# Patient Record
Sex: Female | Born: 1995 | Race: White | Hispanic: No | Marital: Single | Smoking: Current every day smoker
Health system: Southern US, Community
[De-identification: ages and names within clinical notes are randomized; demographics above are authoritative.]

## PROBLEM LIST (undated history)

## (undated) ENCOUNTER — Inpatient Hospital Stay (HOSPITAL_COMMUNITY): Payer: Self-pay

## (undated) DIAGNOSIS — F259 Schizoaffective disorder, unspecified: Secondary | ICD-10-CM

## (undated) DIAGNOSIS — N189 Chronic kidney disease, unspecified: Secondary | ICD-10-CM

## (undated) DIAGNOSIS — F419 Anxiety disorder, unspecified: Secondary | ICD-10-CM

## (undated) DIAGNOSIS — R55 Syncope and collapse: Secondary | ICD-10-CM

## (undated) DIAGNOSIS — F319 Bipolar disorder, unspecified: Secondary | ICD-10-CM

## (undated) DIAGNOSIS — I2699 Other pulmonary embolism without acute cor pulmonale: Secondary | ICD-10-CM

## (undated) DIAGNOSIS — F329 Major depressive disorder, single episode, unspecified: Secondary | ICD-10-CM

## (undated) DIAGNOSIS — J45909 Unspecified asthma, uncomplicated: Secondary | ICD-10-CM

## (undated) DIAGNOSIS — F32A Depression, unspecified: Secondary | ICD-10-CM

## (undated) DIAGNOSIS — R519 Headache, unspecified: Secondary | ICD-10-CM

## (undated) DIAGNOSIS — R51 Headache: Secondary | ICD-10-CM

## (undated) HISTORY — PX: TOOTH EXTRACTION: SUR596

---
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
## 2015-01-24 ENCOUNTER — Emergency Department (HOSPITAL_COMMUNITY)
Admission: EM | Admit: 2015-01-24 | Discharge: 2015-01-24 | Disposition: A | Discharge status: Home / Self Care | Attending: Emergency Medicine | Admitting: Emergency Medicine

## 2015-01-24 ENCOUNTER — Encounter (HOSPITAL_COMMUNITY): Payer: Self-pay | Admitting: Physical Medicine and Rehabilitation

## 2015-01-24 DIAGNOSIS — O99331 Smoking (tobacco) complicating pregnancy, first trimester: Secondary | ICD-10-CM | POA: Insufficient documentation

## 2015-01-24 DIAGNOSIS — F329 Major depressive disorder, single episode, unspecified: Secondary | ICD-10-CM | POA: Insufficient documentation

## 2015-01-24 DIAGNOSIS — Z79899 Other long term (current) drug therapy: Secondary | ICD-10-CM | POA: Diagnosis not present

## 2015-01-24 DIAGNOSIS — Z3A01 Less than 8 weeks gestation of pregnancy: Secondary | ICD-10-CM | POA: Diagnosis not present

## 2015-01-24 DIAGNOSIS — F17210 Nicotine dependence, cigarettes, uncomplicated: Secondary | ICD-10-CM | POA: Diagnosis not present

## 2015-01-24 DIAGNOSIS — F419 Anxiety disorder, unspecified: Secondary | ICD-10-CM | POA: Diagnosis not present

## 2015-01-24 DIAGNOSIS — F259 Schizoaffective disorder, unspecified: Secondary | ICD-10-CM | POA: Insufficient documentation

## 2015-01-24 DIAGNOSIS — Z86711 Personal history of pulmonary embolism: Secondary | ICD-10-CM | POA: Diagnosis not present

## 2015-01-24 DIAGNOSIS — R4589 Other symptoms and signs involving emotional state: Secondary | ICD-10-CM

## 2015-01-24 DIAGNOSIS — O99341 Other mental disorders complicating pregnancy, first trimester: Secondary | ICD-10-CM | POA: Diagnosis not present

## 2015-01-24 LAB — COMPREHENSIVE METABOLIC PANEL
ALBUMIN: 3.9 g/dL (ref 3.5–5.2)
ALT: 13 U/L (ref 0–35)
AST: 20 U/L (ref 0–37)
Alkaline Phosphatase: 45 U/L (ref 39–117)
Anion gap: 7 (ref 5–15)
BUN: 10 mg/dL (ref 6–23)
CHLORIDE: 110 mmol/L (ref 96–112)
CO2: 21 mmol/L (ref 19–32)
Calcium: 9.4 mg/dL (ref 8.4–10.5)
Creatinine, Ser: 0.78 mg/dL (ref 0.50–1.10)
GFR calc non Af Amer: >90 mL/min (ref >90)
Glucose, Bld: 114 mg/dL — ABNORMAL HIGH (ref 70–99)
Potassium: 3.9 mmol/L (ref 3.5–5.1)
SODIUM: 138 mmol/L (ref 135–145)
TOTAL PROTEIN: 6.8 g/dL (ref 6.0–8.3)
Total Bilirubin: 0.2 mg/dL — ABNORMAL LOW (ref 0.3–1.2)

## 2015-01-24 LAB — CBC WITH DIFFERENTIAL/PLATELET
BASOS ABS: 0 10*3/uL (ref 0.0–0.1)
BASOS PCT: 0 % (ref 0–1)
Eosinophils Absolute: 0.1 10*3/uL (ref 0.0–0.7)
Eosinophils Relative: 1 % (ref 0–5)
HCT: 41.6 % (ref 36.0–46.0)
Hemoglobin: 14.5 g/dL (ref 12.0–15.0)
LYMPHS ABS: 2.5 10*3/uL (ref 0.7–4.0)
LYMPHS PCT: 30 % (ref 12–46)
MCH: 31.7 pg (ref 26.0–34.0)
MCHC: 34.9 g/dL (ref 30.0–36.0)
MCV: 91 fL (ref 78.0–100.0)
MONO ABS: 0.4 10*3/uL (ref 0.1–1.0)
Monocytes Relative: 5 % (ref 3–12)
Neutro Abs: 5.3 10*3/uL (ref 1.7–7.7)
Neutrophils Relative %: 64 % (ref 43–77)
PLATELETS: 229 10*3/uL (ref 150–400)
RBC: 4.57 MIL/uL (ref 3.87–5.11)
RDW: 12.8 % (ref 11.5–15.5)
WBC: 8.2 10*3/uL (ref 4.0–10.5)

## 2015-01-24 LAB — RAPID URINE DRUG SCREEN, HOSP PERFORMED
Amphetamines: NOT DETECTED
Barbiturates: NOT DETECTED
Benzodiazepines: NOT DETECTED
Cocaine: NOT DETECTED
OPIATES: NOT DETECTED
TETRAHYDROCANNABINOL: NOT DETECTED

## 2015-01-24 LAB — ACETAMINOPHEN LEVEL: Acetaminophen (Tylenol), Serum: 19.2 ug/mL (ref 10–30)

## 2015-01-24 LAB — SALICYLATE LEVEL

## 2015-01-24 LAB — POC URINE PREG, ED: Preg Test, Ur: POSITIVE — AB

## 2015-01-24 LAB — ETHANOL: Alcohol, Ethyl (B): <5 mg/dL (ref 0–9)

## 2015-01-24 MED ORDER — ONDANSETRON HCL 4 MG PO TABS: 4.0000 mg | ORAL_TABLET | Freq: Three times a day (TID) | ORAL | Status: DC | PRN

## 2015-01-24 MED ORDER — ALBUTEROL SULFATE HFA 108 (90 BASE) MCG/ACT IN AERS
2.0000 | INHALATION_SPRAY | Freq: Once | RESPIRATORY_TRACT | Status: AC
  Administered 2015-01-24: 2 via RESPIRATORY_TRACT
  Filled 2015-01-24: qty 6.7

## 2015-01-24 MED ORDER — ACETAMINOPHEN 325 MG PO TABS: 650.0000 mg | ORAL_TABLET | ORAL | Status: DC | PRN

## 2015-01-24 NOTE — Discharge Instructions (Signed)
Depression °Depression refers to feeling sad, low, down in the dumps, blue, gloomy, or empty. In general, there are two kinds of depression: °· Normal sadness or normal grief. This kind of depression is one that we all feel from time to time after upsetting life experiences, such as the loss of a job or the ending of a relationship. This kind of depression is considered normal, is short lived, and resolves within a few days to 2 weeks. Depression experienced after the loss of a loved one (bereavement) often lasts longer than 2 weeks but normally gets better with time. °· Clinical depression. This kind of depression lasts longer than normal sadness or normal grief or interferes with your ability to function at home, at work, and in school. It also interferes with your personal relationships. It affects almost every aspect of your life. Clinical depression is an illness. °Symptoms of depression can also be caused by conditions other than those mentioned above, such as: °· Physical illness. Some physical illnesses, including underactive thyroid gland (hypothyroidism), severe anemia, specific types of cancer, diabetes, uncontrolled seizures, heart and lung problems, strokes, and chronic pain are commonly associated with symptoms of depression. °· Side effects of some prescription medicine. In some people, certain types of medicine can cause symptoms of depression. °· Substance abuse. Abuse of alcohol and illicit drugs can cause symptoms of depression. °SYMPTOMS °Symptoms of normal sadness and normal grief include the following: °· Feeling sad or crying for short periods of time. °· Not caring about anything (apathy). °· Difficulty sleeping or sleeping too much. °· No longer able to enjoy the things you used to enjoy. °· Desire to be by oneself all the time (social isolation). °· Lack of energy or motivation. °· Difficulty concentrating or remembering. °· Change in appetite or weight. °· Restlessness or  agitation. °Symptoms of clinical depression include the same symptoms of normal sadness or normal grief and also the following symptoms: °· Feeling sad or crying all the time. °· Feelings of guilt or worthlessness. °· Feelings of hopelessness or helplessness. °· Thoughts of suicide or the desire to harm yourself (suicidal ideation). °· Loss of touch with reality (psychotic symptoms). Seeing or hearing things that are not real (hallucinations) or having false beliefs about your life or the people around you (delusions and paranoia). °DIAGNOSIS  °The diagnosis of clinical depression is usually based on how bad the symptoms are and how long they have lasted. Your health care provider will also ask you questions about your medical history and substance use to find out if physical illness, use of prescription medicine, or substance abuse is causing your depression. Your health care provider may also order blood tests. °TREATMENT  °Often, normal sadness and normal grief do not require treatment. However, sometimes antidepressant medicine is given for bereavement to ease the depressive symptoms until they resolve. °The treatment for clinical depression depends on how bad the symptoms are but often includes antidepressant medicine, counseling with a mental health professional, or both. Your health care provider will help to determine what treatment is best for you. °Depression caused by physical illness usually goes away with appropriate medical treatment of the illness. If prescription medicine is causing depression, talk with your health care provider about stopping the medicine, decreasing the dose, or changing to another medicine. °Depression caused by the abuse of alcohol or illicit drugs goes away when you stop using these substances. Some adults need professional help in order to stop drinking or using drugs. °SEEK IMMEDIATE MEDICAL   CARE IF:  You have thoughts about hurting yourself or others.  You lose touch  with reality (have psychotic symptoms).  You are taking medicine for depression and have a serious side effect. FOR MORE INFORMATION  National Alliance on Mental Illness: www.nami.[employer]org  National Institute of Mental Health: http://www.maynard.net/www.nimh.nih.gov Document Released: 12/10/2000 Document Revised: 04/29/2014 Document Reviewed: 03/13/2012 [facility] Patient Information 2015 [place], [place]. This information is not intended to replace advice given to you by your health care provider. Make sure you discuss any questions you have with your health care provider. First Trimester of Pregnancy The first trimester of pregnancy is from week 1 until the end of week 12 (months 1 through 3). A week after a sperm fertilizes an egg, the egg will implant on the wall of the uterus. This embryo will begin to develop into a baby. Genes from you and your partner are forming the baby. The female genes determine whether the baby is a boy or a girl. At 6-8 weeks, the eyes and face are formed, and the heartbeat can be seen on ultrasound. At the end of 12 weeks, all the baby's organs are formed.  Now that you are pregnant, you will want to do everything you can to have a healthy baby. Two of the most important things are to get good prenatal care and to follow your health care provider's instructions. Prenatal care is all the medical care you receive before the baby's birth. This care will help prevent, find, and treat any problems during the pregnancy and childbirth. BODY CHANGES Your body goes through many changes during pregnancy. The changes vary from woman to woman.   You may gain or lose a couple of pounds at first.  You may feel sick to your stomach (nauseous) and throw up (vomit). If the vomiting is uncontrollable, call your health care provider.  You may tire easily.  You may develop headaches that can be relieved by medicines approved by your health care provider.  You may urinate more often. Painful urination may mean  you have a bladder infection.  You may develop heartburn as a result of your pregnancy.  You may develop constipation because certain hormones are causing the muscles that push waste through your intestines to slow down.  You may develop hemorrhoids or swollen, bulging veins (varicose veins).  Your breasts may begin to grow larger and become tender. Your nipples may stick out more, and the tissue that surrounds them (areola) may become darker.  Your gums may bleed and may be sensitive to brushing and flossing.  Dark spots or blotches (chloasma, mask of pregnancy) may develop on your face. This will likely fade after the baby is born.  Your menstrual periods will stop.  You may have a loss of appetite.  You may develop cravings for certain kinds of food.  You may have changes in your emotions from day to day, such as being excited to be pregnant or being concerned that something may go wrong with the pregnancy and baby.  You may have more vivid and strange dreams.  You may have changes in your hair. These can include thickening of your hair, rapid growth, and changes in texture. Some women also have hair loss during or after pregnancy, or hair that feels dry or thin. Your hair will most likely return to normal after your baby is born. WHAT TO EXPECT AT YOUR PRENATAL VISITS During a routine prenatal visit:  You will be weighed to make sure you and the  baby are growing normally.  Your blood pressure will be taken.  Your abdomen will be measured to track your baby's growth.  The fetal heartbeat will be listened to starting around week 10 or 12 of your pregnancy.  Test results from any previous visits will be discussed. Your health care provider may ask you:  How you are feeling.  If you are feeling the baby move.  If you have had any abnormal symptoms, such as leaking fluid, bleeding, severe headaches, or abdominal cramping.  If you have any questions. Other tests that may  be performed during your first trimester include:  Blood tests to find your blood type and to check for the presence of any previous infections. They will also be used to check for low iron levels (anemia) and Rh antibodies. Later in the pregnancy, blood tests for diabetes will be done along with other tests if problems develop.  Urine tests to check for infections, diabetes, or protein in the urine.  An ultrasound to confirm the proper growth and development of the baby.  An amniocentesis to check for possible genetic problems.  Fetal screens for spina bifida and Down syndrome.  You may need other tests to make sure you and the baby are doing well. HOME CARE INSTRUCTIONS  Medicines  Follow your health care provider's instructions regarding medicine use. Specific medicines may be either safe or unsafe to take during pregnancy.  Take your prenatal vitamins as directed.  If you develop constipation, try taking a stool softener if your health care provider approves. Diet  Eat regular, well-balanced meals. Choose a variety of foods, such as meat or vegetable-based protein, fish, milk and low-fat dairy products, vegetables, fruits, and whole grain breads and cereals. Your health care provider will help you determine the amount of weight gain that is right for you.  Avoid raw meat and uncooked cheese. These carry germs that can cause birth defects in the baby.  Eating four or five small meals rather than three large meals a day may help relieve nausea and vomiting. If you start to feel nauseous, eating a few soda crackers can be helpful. Drinking liquids between meals instead of during meals also seems to help nausea and vomiting.  If you develop constipation, eat more high-fiber foods, such as fresh vegetables or fruit and whole grains. Drink enough fluids to keep your urine clear or pale yellow. Activity and Exercise  Exercise only as directed by your health care provider. Exercising will  help you:  Control your weight.  Stay in shape.  Be prepared for labor and delivery.  Experiencing pain or cramping in the lower abdomen or low back is a good sign that you should stop exercising. Check with your health care provider before continuing normal exercises.  Try to avoid standing for long periods of time. Move your legs often if you must stand in one place for a long time.  Avoid heavy lifting.  Wear low-heeled shoes, and practice good posture.  You may continue to have sex unless your health care provider directs you otherwise. Relief of Pain or Discomfort  Wear a good support bra for breast tenderness.   Take warm sitz baths to soothe any pain or discomfort caused by hemorrhoids. Use hemorrhoid cream if your health care provider approves.   Rest with your legs elevated if you have leg cramps or low back pain.  If you develop varicose veins in your legs, wear support hose. Elevate your feet for 15 minutes, 3-4  times a day. Limit salt in your diet. Prenatal Care  Schedule your prenatal visits by the twelfth week of pregnancy. They are usually scheduled monthly at first, then more often in the last 2 months before delivery.  Write down your questions. Take them to your prenatal visits.  Keep all your prenatal visits as directed by your health care provider. Safety  Wear your seat belt at all times when driving.  Make a list of emergency phone numbers, including numbers for family, friends, the hospital, and police and fire departments. General Tips  Ask your health care provider for a referral to a local prenatal education class. Begin classes no later than at the beginning of month 6 of your pregnancy.  Ask for help if you have counseling or nutritional needs during pregnancy. Your health care provider can offer advice or refer you to specialists for help with various needs.  Do not use hot tubs, steam rooms, or saunas.  Do not douche or use tampons or  scented sanitary pads.  Do not cross your legs for long periods of time.  Avoid cat litter boxes and soil used by cats. These carry germs that can cause birth defects in the baby and possibly loss of the fetus by miscarriage or stillbirth.  Avoid all smoking, herbs, alcohol, and medicines not prescribed by your health care provider. Chemicals in these affect the formation and growth of the baby.  Schedule a dentist appointment. At home, brush your teeth with a soft toothbrush and be gentle when you floss. SEEK MEDICAL CARE IF:   You have dizziness.  You have mild pelvic cramps, pelvic pressure, or nagging pain in the abdominal area.  You have persistent nausea, vomiting, or diarrhea.  You have a bad smelling vaginal discharge.  You have pain with urination.  You notice increased swelling in your face, hands, legs, or ankles. SEEK IMMEDIATE MEDICAL CARE IF:   You have a fever.  You are leaking fluid from your vagina.  You have spotting or bleeding from your vagina.  You have severe abdominal cramping or pain.  You have rapid weight gain or loss.  You vomit blood or material that looks like coffee grounds.  You are exposed to [place] measles and have never had them.  You are exposed to fifth disease or chickenpox.  You develop a severe headache.  You have shortness of breath.  You have any kind of trauma, such as from a fall or a car accident. Document Released: 12/07/2001 Document Revised: 04/29/2014 Document Reviewed: 10/23/2013 [facility] Patient Information 2015 [place], [place]. This information is not intended to replace advice given to you by your health care provider. Make sure you discuss any questions you have with your health care provider.
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
## 2015-01-24 NOTE — BH Assessment (Signed)
[facility]  Received consult, attempted to call EDP, but no one answered.  No documentation that pt has been seen by an MD yet, but TTS will begin evaluation.

## 2015-01-24 NOTE — ED Notes (Signed)
Consulting civil engineerCharge RN, staffing, and security notified. Pt changed into paper scrubs.
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
## 2015-01-24 NOTE — ED Notes (Signed)
Pt presents to department for evaluation of suicidal ideation. Pt states she recently found out she is pregnant. States she wants to kill herself, also states she feels like cutting her arms. Denies HI. Pt is alert and oriented x4.

## 2015-01-24 NOTE — ED Notes (Signed)
Telepsych in progress. 

## 2015-01-24 NOTE — ED Notes (Addendum)
Pt stated that "I want to get my mind right so I know what I need to do so I can handle being a single mother." Pt stated that she didn't know her father growing up and didn't meet him until she was 19 years old and he then passed away when she was [age] years years old. Pt stated that it really upset her when he passed away and it is a very big deal to her that her baby has it's father.   Pt stated that she has not made any self harm attempts, but was thinking about cutting herself with a kitchen knife which she does have acces to. The pt stated that she was having self harming thoughts just today.   Pt states that due date is September 08, 2015

## 2015-01-24 NOTE — ED Notes (Signed)
PATIENT REPORTS THE FATHER OF HER CHILD IS ALSO HERE FOR THE SAME COMPLAINT AS HERS

## 2015-01-24 NOTE — ED Provider Notes (Signed)
CSN: [id number]     Arrival date & time 01/24/15  1604 History   First MD Initiated Contact with Patient 01/24/15 1809     Chief Complaint  Patient presents with  . Suicidal   Helen Blackburn is a 19 y.o. female who is [redacted] weeks pregnant who presents to the ED complaining of low mood and needing someone to talk to. She reports feeling depressed and is anxious about being pregnant. She denies any suicidal or homicidal ideations. She also denies any self-harm thoughts. Patient does report she has had a previous suicide attempt with overdosing on trazodone two years ago. She reports she does not feel this way currently and would not harm herself. She reports she just wanted someone to talk to and now that she has spoken with the sitter she feels much better. She denies auditory or visual hallucinations. She denies thought withdrawal, thought broadcasting or thought insertion. She denies paranoia. She reports she has a mother who lives in [place] who could pick her up tonight. She also reports she has psychiatrist that she can see at [facility]. She also reports she has an appointment with an OB/GYN for the beginning of next week. She reports she wishes to keep these appointments and does not wish to stay the night here. The patient denies any physical complaints. Patient denies fevers, chills, recent illness, abdominal pain, vomiting, chest pain, shortness of breath, cough, wheezing, vaginal bleeding or vaginal discharge.  (Consider location/radiation/quality/duration/timing/severity/associated sxs/prior Treatment) HPI  Past Medical History  Diagnosis Date  . Pulmonary embolism on left   . Schizoaffective disorder   . Bipolar affective disorder 04/24/2014   History reviewed. No pertinent past surgical history. Family History  Problem Relation Age of Onset  . Heart attack Father    History  Substance Use Topics  . Smoking status: Current Every Day Smoker -- 1.00 packs/day    Types: Cigarettes   . Smokeless tobacco: Never Used  . Alcohol Use: No   OB History    No data available     Review of Systems  Constitutional: Negative for fever, chills and appetite change.  HENT: Negative for congestion and sore throat.   Eyes: Negative for visual disturbance.  Respiratory: Negative for cough, shortness of breath and wheezing.   Cardiovascular: Negative for chest pain and palpitations.  Gastrointestinal: Negative for nausea, vomiting, abdominal pain and diarrhea.  Genitourinary: Negative for dysuria, vaginal bleeding and vaginal discharge.  Musculoskeletal: Negative for back pain and neck pain.  Skin: Negative for rash.  Neurological: Negative for weakness, light-headedness and headaches.  Psychiatric/Behavioral: Negative for suicidal ideas, hallucinations and self-injury. The patient is nervous/anxious.       Allergies  Yaz  Home Medications   Prior to Admission medications   Medication Sig Start Date End Date Taking? Authorizing Provider  ARIPiprazole (ABILIFY) 5 MG tablet Take 1 tablet (5 mg total) by mouth at bedtime. 04/30/14   Verne Spurr, PA-C  traZODone (DESYREL) 100 MG tablet Take 1 tablet (100 mg total) by mouth at bedtime. 04/30/14   Verne Spurr, PA-C   BP 97/75 mmHg  Pulse 73  Temp(Src) 98.4 F (36.9 C) (Oral)  Resp 16  SpO2 99% Physical Exam  Constitutional: She is oriented to person, place, and time. She appears well-developed and well-nourished. No distress.  Patient resting comfortably in bed eating a sandwich.   HENT:  Head: Normocephalic and atraumatic.  Eyes: Right eye exhibits no discharge. Left eye exhibits no discharge.  Cardiovascular: Normal rate,  regular rhythm, normal heart sounds and intact distal pulses.  Exam reveals no gallop and no friction rub.   No murmur heard. Pulmonary/Chest: Effort normal and breath sounds normal. No respiratory distress. She has no wheezes. She has no rales.  Abdominal: Soft. Bowel sounds are normal. She exhibits  no distension. There is no tenderness.  Abdomen is soft and nontender to palpation.  Neurological: She is alert and oriented to person, place, and time. Coordination normal.  Skin: No rash noted. She is not diaphoretic.  Psychiatric: Her speech is normal and behavior is normal. Her mood appears anxious. Thought content is not paranoid. She does not exhibit a depressed mood. She expresses no homicidal and no suicidal ideation.  Patient appears slightly anxious but not depressed. She denies SI/HI. Denies VH or AH.   Nursing note and vitals reviewed.   ED Course  Procedures (including critical care time) Labs Review Labs Reviewed  COMPREHENSIVE METABOLIC PANEL - Abnormal; Notable for the following:    Glucose, Bld 114 (*)    Total Bilirubin 0.2 (*)    All other components within normal limits  POC URINE PREG, ED - Abnormal; Notable for the following:    Preg Test, Ur POSITIVE (*)    All other components within normal limits  CBC WITH DIFFERENTIAL/PLATELET  ETHANOL  SALICYLATE LEVEL  ACETAMINOPHEN LEVEL  URINE RAPID DRUG SCREEN (HOSP PERFORMED)    Imaging Review No results found.   EKG Interpretation None      Filed Vitals:   01/24/15 1609 01/24/15 1853  BP: 125/74 97/75  Pulse: 101 73  Temp: 98 F (36.7 C) 98.4 F (36.9 C)  TempSrc: Oral Oral  Resp: 18 16  SpO2: 98% 99%     MDM   Meds given in ED:  Medications  acetaminophen (TYLENOL) tablet 650 mg (not administered)  ondansetron (ZOFRAN) tablet 4 mg (not administered)  albuterol (PROVENTIL HFA;VENTOLIN HFA) 108 (90 BASE) MCG/ACT inhaler 2 puff (2 puffs Inhalation Given 01/24/15 1802)    New Prescriptions   No medications on file    Final diagnoses:  Depressed mood   This is an 19 year old female with a history of bipolar disorder and schizoaffective disorder who presents the emergency department complaining of feeling low and hopeless. Patient is also [redacted] weeks pregnant. The patient initially also complained  of some wheezing and requests albuterol inhaler. Patient reports is completely resolved after albuterol inhaler. I evaluated the patient after she spoke with behavioral health team. Patient denies any suicidal or homicidal ideations. She denies auditory or visual hallucinations. Patient reports she is feeling low and hopeless and needing someone to talk to. She reports she spoke with the sitter who is with her and felt much better. The patient has a sister and a mother who live in the area and are willing to stay with them. She also reports she has a Therapist, sports at [facility]. She also reports having an appointment at the beginning of next week with an OB/GYN. She reports she wishes to keep this appointment to start her prenatal care. Patient also reports she had a previous ultrasound at anther ED to confirm her pregnancy. She denies any abdominal pain. Irving Burton from behavioral health reports she feels this patient is low risk. After evaluating the patient and I agree and I feel she is safe to be discharged to the care of her mother. Strict return precautions were provided. I advised patient to keep her follow-up appointments with her psychiatrist and OB/GYN. Advised patient to  return to the emergency department for new or worsening symptoms or new concerns. The patient verbalized understanding and agreement with plan.  This patient was discussed with Dr. Effie ShyWentz who agrees with assessment and plan.     Lawana ChambersWilliam Duncan Greidys Deland, PA-C 01/24/15 2000  Flint MelterElliott L Wentz, MD 01/25/15 1155

## 2015-03-19 ENCOUNTER — Encounter (HOSPITAL_COMMUNITY): Payer: Self-pay | Admitting: *Deleted

## 2015-03-19 ENCOUNTER — Inpatient Hospital Stay (HOSPITAL_COMMUNITY)
Admission: AD | Admit: 2015-03-19 | Discharge: 2015-03-19 | Disposition: A | Discharge status: Home / Self Care | Source: Ambulatory Visit | Attending: Obstetrics & Gynecology | Admitting: Obstetrics & Gynecology

## 2015-03-19 DIAGNOSIS — O99332 Smoking (tobacco) complicating pregnancy, second trimester: Secondary | ICD-10-CM | POA: Diagnosis not present

## 2015-03-19 DIAGNOSIS — O99342 Other mental disorders complicating pregnancy, second trimester: Secondary | ICD-10-CM | POA: Diagnosis not present

## 2015-03-19 DIAGNOSIS — O99612 Diseases of the digestive system complicating pregnancy, second trimester: Secondary | ICD-10-CM | POA: Insufficient documentation

## 2015-03-19 DIAGNOSIS — R109 Unspecified abdominal pain: Secondary | ICD-10-CM | POA: Diagnosis present

## 2015-03-19 DIAGNOSIS — O0932 Supervision of pregnancy with insufficient antenatal care, second trimester: Secondary | ICD-10-CM | POA: Insufficient documentation

## 2015-03-19 DIAGNOSIS — Z79899 Other long term (current) drug therapy: Secondary | ICD-10-CM | POA: Diagnosis not present

## 2015-03-19 DIAGNOSIS — O2342 Unspecified infection of urinary tract in pregnancy, second trimester: Secondary | ICD-10-CM | POA: Insufficient documentation

## 2015-03-19 DIAGNOSIS — F259 Schizoaffective disorder, unspecified: Secondary | ICD-10-CM | POA: Insufficient documentation

## 2015-03-19 DIAGNOSIS — A084 Viral intestinal infection, unspecified: Secondary | ICD-10-CM | POA: Diagnosis not present

## 2015-03-19 DIAGNOSIS — Z3A15 15 weeks gestation of pregnancy: Secondary | ICD-10-CM | POA: Insufficient documentation

## 2015-03-19 DIAGNOSIS — Z86711 Personal history of pulmonary embolism: Secondary | ICD-10-CM | POA: Insufficient documentation

## 2015-03-19 HISTORY — DX: Headache: R51

## 2015-03-19 HISTORY — DX: Unspecified asthma, uncomplicated: J45.909

## 2015-03-19 HISTORY — DX: Headache, unspecified: R51.9

## 2015-03-19 HISTORY — DX: Depression, unspecified: F32.A

## 2015-03-19 HISTORY — DX: Bipolar disorder, unspecified: F31.9

## 2015-03-19 HISTORY — DX: Anxiety disorder, unspecified: F41.9

## 2015-03-19 HISTORY — DX: Major depressive disorder, single episode, unspecified: F32.9

## 2015-03-19 HISTORY — DX: Chronic kidney disease, unspecified: N18.9

## 2015-03-19 LAB — COMPREHENSIVE METABOLIC PANEL
ALT: 13 U/L (ref 0–35)
AST: 19 U/L (ref 0–37)
Albumin: 3.7 g/dL (ref 3.5–5.2)
Alkaline Phosphatase: 38 U/L — ABNORMAL LOW (ref 39–117)
Anion gap: 7 (ref 5–15)
BUN: 9 mg/dL (ref 6–23)
CALCIUM: 9.1 mg/dL (ref 8.4–10.5)
CO2: 25 mmol/L (ref 19–32)
Chloride: 105 mmol/L (ref 96–112)
Creatinine, Ser: 0.65 mg/dL (ref 0.50–1.10)
GFR calc Af Amer: >90 mL/min (ref >90)
GFR calc non Af Amer: >90 mL/min (ref >90)
Glucose, Bld: 91 mg/dL (ref 70–99)
Potassium: 3.7 mmol/L (ref 3.5–5.1)
Sodium: 137 mmol/L (ref 135–145)
TOTAL PROTEIN: 7 g/dL (ref 6.0–8.3)
Total Bilirubin: 0.2 mg/dL — ABNORMAL LOW (ref 0.3–1.2)

## 2015-03-19 LAB — CBC
HEMATOCRIT: 37.2 % (ref 36.0–46.0)
Hemoglobin: 12.7 g/dL (ref 12.0–15.0)
MCH: 31.9 pg (ref 26.0–34.0)
MCHC: 34.1 g/dL (ref 30.0–36.0)
MCV: 93.5 fL (ref 78.0–100.0)
Platelets: 194 10*3/uL (ref 150–400)
RBC: 3.98 MIL/uL (ref 3.87–5.11)
RDW: 13.2 % (ref 11.5–15.5)
WBC: 6.6 10*3/uL (ref 4.0–10.5)

## 2015-03-19 LAB — URINALYSIS, ROUTINE W REFLEX MICROSCOPIC
BILIRUBIN URINE: NEGATIVE
GLUCOSE, UA: NEGATIVE mg/dL
KETONES UR: NEGATIVE mg/dL
Nitrite: POSITIVE — AB
PROTEIN: NEGATIVE mg/dL
Specific Gravity, Urine: 1.015 (ref 1.005–1.030)
Urobilinogen, UA: 0.2 mg/dL (ref 0.0–1.0)
pH: 6.5 (ref 5.0–8.0)

## 2015-03-19 LAB — WET PREP, GENITAL
TRICH WET PREP: NONE SEEN
Yeast Wet Prep HPF POC: NONE SEEN

## 2015-03-19 LAB — URINE MICROSCOPIC-ADD ON

## 2015-03-19 LAB — ANTITHROMBIN III: AntiThromb III Func: 89 % (ref 75–120)

## 2015-03-19 MED ORDER — PROMETHAZINE HCL 25 MG PO TABS: 12.5000 mg | ORAL_TABLET | Freq: Four times a day (QID) | ORAL | Status: DC | PRN

## 2015-03-19 MED ORDER — ENOXAPARIN SODIUM 40 MG/0.4ML ~~LOC~~ SOLN: 40.0000 mg | SUBCUTANEOUS | Status: DC

## 2015-03-19 MED ORDER — NITROFURANTOIN MONOHYD MACRO 100 MG PO CAPS: 100.0000 mg | ORAL_CAPSULE | Freq: Two times a day (BID) | ORAL | Status: DC

## 2015-03-19 NOTE — MAU Note (Signed)
Started yesterday, diarrhea and vomiting. Bad cramping

## 2015-03-19 NOTE — MAU Provider Note (Signed)
Chief Complaint: Diarrhea; Emesis; and Abdominal Cramping   First Provider Initiated Contact with Patient 03/19/15 1308      SUBJECTIVE HPI: Helen Blackburn is a 19 y.o. G2P0010 at 3825w2d by LMP who presents to maternity admissions reporting nausea throughout pregnancy but onset of vomiting, diarrhea, and abdominal cramping yesterday.  She also reports increased vaginal discharge.  She denies emesis or diarrhea today.  Last intercourse was this morning.  She has not yet started prenatal care and wants to know the sex of the baby.  She denies vaginal bleeding, vaginal itching/burning, urinary symptoms, h/a, dizziness, or fever/chills.    Reviewed pt hx of PE while on OCPs with pt.  She reports she was told she should not take estrogen again and should let her provider know about her history if she became pregnant.  Past Medical History  Diagnosis Date  . Pulmonary embolism on left   . Schizoaffective disorder   . Bipolar affective disorder 04/24/2014  . Anxiety   . Bipolar 1 disorder   . Depression   . Asthma   . Headache   . Chronic kidney disease     frequent UTI   History reviewed. No pertinent past surgical history. History   Social History  . Marital Status: Single    Spouse Name: N/A  . Number of Children: N/A  . Years of Education: N/A   Occupational History  . Not on file.   Social History Main Topics  . Smoking status: Current Every Day Smoker -- 0.25 packs/day    Types: Cigarettes  . Smokeless tobacco: Never Used  . Alcohol Use: No  . Drug Use: Yes    Special: Marijuana     Comment: last use Mar 18, 2015  . Sexual Activity: Yes    Birth Control/ Protection: None     Comment: last sex Mar 18 2015   Other Topics Concern  . Not on file   Social History Narrative   No current facility-administered medications on file prior to encounter.   Current Outpatient Prescriptions on File Prior to Encounter  Medication Sig Dispense Refill  . ARIPiprazole (ABILIFY) 5 MG  tablet Take 1 tablet (5 mg total) by mouth at bedtime. (Patient not taking: Reported on 03/19/2015) 30 tablet 0  . traZODone (DESYREL) 100 MG tablet Take 1 tablet (100 mg total) by mouth at bedtime. (Patient not taking: Reported on 03/19/2015) 30 tablet 0   Allergies  Allergen Reactions  . Peanuts [Peanut Oil] Anaphylaxis  . Yaz [Drospirenone-Ethinyl Estradiol]     Caused a pulmonary embolism.  Suppose to avoid estrogens    ROS: Pertinent items in HPI  OBJECTIVE Blood pressure 102/50, pulse 66, temperature 98.1 F (36.7 C), temperature source Oral, resp. rate 16, height 5' 5.5" (1.664 m), weight 65.772 kg (145 lb), last menstrual period 12/19/2014. GENERAL: Well-developed, well-nourished female in no acute distress.  HEENT: Normocephalic HEART: normal rate RESP: normal effort ABDOMEN: Soft, non-tender EXTREMITIES: Nontender, no edema NEURO: Alert and oriented Pelvic exam: Cervix pink, visually closed, without lesion, moderate amount thick white discharge, vaginal walls and external genitalia normal Cervix 0/thick/high, posterior  FHT positive on doppler  LAB RESULTS Results for orders placed or performed during the hospital encounter of 03/19/15 (from the past 24 hour(s))  Urinalysis, Routine w reflex microscopic     Status: Abnormal   Collection Time: 03/19/15 11:45 AM  Result Value Ref Range   Color, Urine YELLOW YELLOW   APPearance CLOUDY (A) CLEAR   Specific Gravity, Urine  1.015 1.005 - 1.030   pH 6.5 5.0 - 8.0   Glucose, UA NEGATIVE NEGATIVE mg/dL   Hgb urine dipstick TRACE (A) NEGATIVE   Bilirubin Urine NEGATIVE NEGATIVE   Ketones, ur NEGATIVE NEGATIVE mg/dL   Protein, ur NEGATIVE NEGATIVE mg/dL   Urobilinogen, UA 0.2 0.0 - 1.0 mg/dL   Nitrite POSITIVE (A) NEGATIVE   Leukocytes, UA LARGE (A) NEGATIVE  Urine microscopic-add on     Status: Abnormal   Collection Time: 03/19/15 11:45 AM  Result Value Ref Range   Squamous Epithelial / LPF FEW (A) RARE   WBC, UA 7-10 <3  WBC/hpf   Bacteria, UA MANY (A) RARE  Wet prep, genital     Status: Abnormal   Collection Time: 03/19/15  1:20 PM  Result Value Ref Range   Yeast Wet Prep HPF POC NONE SEEN NONE SEEN   Trich, Wet Prep NONE SEEN NONE SEEN   Clue Cells Wet Prep HPF POC FEW (A) NONE SEEN   WBC, Wet Prep HPF POC FEW (A) NONE SEEN  CBC     Status: None   Collection Time: 03/19/15  2:20 PM  Result Value Ref Range   WBC 6.6 4.0 - 10.5 K/uL   RBC 3.98 3.87 - 5.11 MIL/uL   Hemoglobin 12.7 12.0 - 15.0 g/dL   HCT 45.4 09.8 - 11.9 %   MCV 93.5 78.0 - 100.0 fL   MCH 31.9 26.0 - 34.0 pg   MCHC 34.1 30.0 - 36.0 g/dL   RDW 14.7 82.9 - 56.2 %   Platelets 194 150 - 400 K/uL  Comprehensive metabolic panel     Status: Abnormal   Collection Time: 03/19/15  2:20 PM  Result Value Ref Range   Sodium 137 135 - 145 mmol/L   Potassium 3.7 3.5 - 5.1 mmol/L   Chloride 105 96 - 112 mmol/L   CO2 25 19 - 32 mmol/L   Glucose, Bld 91 70 - 99 mg/dL   BUN 9 6 - 23 mg/dL   Creatinine, Ser 1.30 0.50 - 1.10 mg/dL   Calcium 9.1 8.4 - 86.5 mg/dL   Total Protein 7.0 6.0 - 8.3 g/dL   Albumin 3.7 3.5 - 5.2 g/dL   AST 19 0 - 37 U/L   ALT 13 0 - 35 U/L   Alkaline Phosphatase 38 (L) 39 - 117 U/L   Total Bilirubin 0.2 (L) 0.3 - 1.2 mg/dL   GFR calc non Af Amer >90 >90 mL/min   GFR calc Af Amer >90 >90 mL/min   Anion gap 7 5 - 15    IMAGING No results found.  ASSESSMENT 1. Viral gastroenteritis   2. Late prenatal care affecting pregnancy in second trimester, antepartum   3. Hx pulmonary embolism   4. UTI in pregnancy, second trimester     PLAN Consult Dr Macon Large Discharge home Thrombosis panel ordered  Macrobid 100 mg BID x 7 days Urine culture sent Phenergan 12.5-25 mg PO Q 6 hours Lovenox 40 mg injection daily Message sent to [facility] to begin care Outpatient anatomy U/S ordered Return to MAU as needed for emergencies    Medication List    STOP taking these medications        ARIPiprazole 5 MG tablet  Commonly  known as:  ABILIFY     traZODone 100 MG tablet  Commonly known as:  DESYREL      TAKE these medications        acetaminophen 500 MG tablet  Commonly known  as:  TYLENOL  Take 500 mg by mouth every 6 (six) hours as needed for mild pain or headache.     enoxaparin 40 MG/0.4ML injection  Commonly known as:  LOVENOX  Inject 0.4 mLs (40 mg total) into the skin daily.     nitrofurantoin (macrocrystal-monohydrate) 100 MG capsule  Commonly known as:  MACROBID  Take 1 capsule (100 mg total) by mouth 2 (two) times daily.     promethazine 25 MG tablet  Commonly known as:  PHENERGAN  Take 0.5-1 tablets (12.5-25 mg total) by mouth every 6 (six) hours as needed.       Follow-up Information    Follow up with [facility].   Specialty:  Obstetrics and Gynecology   Why:  The clinic will call you with appointment.   Contact information:   [address] [place] [address] [phone]      Follow up with [facility] [place] ULTRASOUND.   Specialty:  Radiology   Why:  Ultrasound will call you with appointment.   Contact information:   [address] [id number] mc [place] [address] [phone]      Follow up with [facility] [place] MATERNITY ADMISSIONS.   Why:  As needed for emergencies   Contact information:   [address] [id number] mc [place] [address] [phone]      Sharen Counter Certified Nurse-Midwife 03/19/2015  4:09 PM

## 2015-03-19 NOTE — Discharge Instructions (Signed)
Viral Gastroenteritis °Viral gastroenteritis is also known as stomach flu. This condition affects the stomach and intestinal tract. It can cause sudden diarrhea and vomiting. The illness typically lasts 3 to 8 days. Most people develop an immune response that eventually gets rid of the virus. While this natural response develops, the virus can make you quite ill. °CAUSES  °Many different viruses can cause gastroenteritis, such as rotavirus or noroviruses. You can catch one of these viruses by consuming contaminated food or water. You may also catch a virus by sharing utensils or other personal items with an infected person or by touching a contaminated surface. °SYMPTOMS  °The most common symptoms are diarrhea and vomiting. These problems can cause a severe loss of body fluids (dehydration) and a body salt (electrolyte) imbalance. Other symptoms may include: °· Fever. °· Headache. °· Fatigue. °· Abdominal pain. °DIAGNOSIS  °Your caregiver can usually diagnose viral gastroenteritis based on your symptoms and a physical exam. A stool sample may also be taken to test for the presence of viruses or other infections. °TREATMENT  °This illness typically goes away on its own. Treatments are aimed at rehydration. The most serious cases of viral gastroenteritis involve vomiting so severely that you are not able to keep fluids down. In these cases, fluids must be given through an intravenous line (IV). °HOME CARE INSTRUCTIONS  °· Drink enough fluids to keep your urine clear or pale yellow. Drink small amounts of fluids frequently and increase the amounts as tolerated. °· Ask your caregiver for specific rehydration instructions. °· Avoid: °· Foods high in sugar. °· Alcohol. °· Carbonated drinks. °· Tobacco. °· Juice. °· Caffeine drinks. °· Extremely hot or cold fluids. °· Fatty, greasy foods. °· Too much intake of anything at one time. °· Dairy products until 24 to 48 hours after diarrhea stops. °· You may consume probiotics.  Probiotics are active cultures of beneficial bacteria. They may lessen the amount and number of diarrheal stools in adults. Probiotics can be found in yogurt with active cultures and in supplements. °· Wash your hands well to avoid spreading the virus. °· Only take over-the-counter or prescription medicines for pain, discomfort, or fever as directed by your caregiver. Do not give aspirin to children. Antidiarrheal medicines are not recommended. °· Ask your caregiver if you should continue to take your regular prescribed and over-the-counter medicines. °· Keep all follow-up appointments as directed by your caregiver. °SEEK IMMEDIATE MEDICAL CARE IF:  °· You are unable to keep fluids down. °· You do not urinate at least once every 6 to 8 hours. °· You develop shortness of breath. °· You notice blood in your stool or vomit. This may look like coffee grounds. °· You have abdominal pain that increases or is concentrated in one small area (localized). °· You have persistent vomiting or diarrhea. °· You have a fever. °· The patient is a child younger than 3 months, and he or she has a fever. °· The patient is a child older than 3 months, and he or she has a fever and persistent symptoms. °· The patient is a child older than 3 months, and he or she has a fever and symptoms suddenly get worse. °· The patient is a baby, and he or she has no tears when crying. °MAKE SURE YOU:  °· Understand these instructions. °· Will watch your condition. °· Will get help right away if you are not doing well or get worse. °Document Released: 12/13/2005 Document Revised: 03/06/2012 Document Reviewed: 09/29/2011 °  [employer] Patient Information 2015 [place], [place]. This information is not intended to replace advice given to you by your health care provider. Make sure you discuss any questions you have with your health care provider. Pulmonary Embolism A pulmonary (lung) embolism (PE) is a blood clot that has traveled to the lung and results in  a blockage of blood flow in the affected lung. Most clots come from deep veins in the legs or pelvis. PE is a dangerous and potentially life-threatening condition that can be treated if identified. CAUSES Blood clots form in a vein for different reasons. Usually several things cause blood clots. They include:  The flow of blood slows down.  The inside of the vein is damaged in some way.  The person has a condition that makes the blood clot more easily. RISK FACTORS Some people are more likely than others to develop PE. Risk factors include:   Smoking.  Being overweight (obese).  Sitting or lying still for a long time. This includes long-distance travel, paralysis, or recovery from an illness or surgery. Other factors that increase risk are:   Older age, especially over 38 years of age.  Having a family history of blood clots or if you have already had a blood clot.  Having major or lengthy surgery. This is especially true for surgery on the hip, knee, or belly (abdomen). Hip surgery is particularly high risk.  Having a long, thin tube (catheter) placed inside a vein during a medical procedure.  Breaking a hip or leg.  Having cancer or cancer treatment.  Medicines containing the female hormone estrogen. This includes birth control pills and hormone replacement therapy.  Other circulation or heart problems.  Pregnancy and childbirth.  Hormone changes make the blood clot more easily during pregnancy.  The fetus puts pressure on the veins of the pelvis.  There is a risk of injury to veins during delivery or a caesarean delivery. The risk is highest just after childbirth.  PREVENTION   Exercise the legs regularly. Take a brisk 30 minute walk every day.  Maintain a weight that is appropriate for your height.  Avoid sitting or lying in bed for long periods of time without moving your legs.  Women, particularly those over the age of 35 years, should consider the risks and  benefits of taking estrogen medicines, including birth control pills.  Do not smoke, especially if you take estrogen medicines.  Long-distance travel can increase your risk. You should exercise your legs by walking or pumping the muscles every hour.  Many of the risk factors above relate to situations that exist with hospitalization, either for illness, injury, or elective surgery. Prevention may include medical and nonmedical measures.   Your health care provider will assess you for the need for venous thromboembolism prevention when you are admitted to the hospital. If you are having surgery, your surgeon will assess you the day of or day after surgery.  SYMPTOMS  The symptoms of a PE usually start suddenly and include:  Shortness of breath.  Coughing.  Coughing up blood or blood-tinged mucus.  Chest pain. Pain is often worse with deep breaths.  Rapid heartbeat. DIAGNOSIS  If a PE is suspected, your health care provider will take a medical history and perform a physical exam. Other tests that may be required include:  Blood tests, such as studies of the clotting properties of your blood.  Imaging tests, such as ultrasound, CT, MRI, and other tests to see if you have clots in  your legs or lungs.  An electrocardiogram. This can look for heart strain from blood clots in the lungs. TREATMENT   The most common treatment for a PE is blood thinning (anticoagulant) medicine, which reduces the blood's tendency to clot. Anticoagulants can stop new blood clots from forming and old clots from growing. They cannot dissolve existing clots. Your body does this by itself over time. Anticoagulants can be given by mouth, through an intravenous (IV) tube, or by injection. Your health care provider will determine the best program for you.  Less commonly, clot-dissolving medicines (thrombolytics) are used to dissolve a PE. They carry a high risk of bleeding, so they are used mainly in severe  cases.  Very rarely, a blood clot in the leg needs to be removed surgically.  If you are unable to take anticoagulants, your health care provider may arrange for you to have a filter placed in a main vein in your abdomen. This filter prevents clots from traveling to your lungs. HOME CARE INSTRUCTIONS   Take all medicines as directed by your health care provider.  Learn as much as you can about DVT.  Wear a medical alert bracelet or carry a medical alert card.  Ask your health care provider how soon you can go back to normal activities. It is important to stay active to prevent blood clots. If you are on anticoagulant medicine, avoid contact sports.  It is very important to exercise. This is especially important while traveling, sitting, or standing for long periods of time. Exercise your legs by walking or by tightening and relaxing your leg muscles regularly. Take frequent walks.  You may need to wear compression stockings. These are tight elastic stockings that apply pressure to the lower legs. This pressure can help keep the blood in the legs from clotting. Taking Warfarin Warfarin is a daily medicine that is taken by mouth. Your health care provider will advise you on the length of treatment (usually 3-6 months, sometimes lifelong). If you take warfarin:  Understand how to take warfarin and foods that can affect how warfarin works in Public relations account executiveyour body.  Too much and too little warfarin are both dangerous. Too much warfarin increases the risk of bleeding. Too little warfarin continues to allow the risk for blood clots. Warfarin and Regular Blood Testing While taking warfarin, you will need to have regular blood tests to measure your blood clotting time. These blood tests usually include both the prothrombin time (PT) and international normalized ratio (INR) tests. The PT and INR results allow your health care provider to adjust your dose of warfarin. It is very important that you have your PT and  INR tested as often as directed by your health care provider.  Warfarin and Your Diet Avoid major changes in your diet, or notify your health care provider before changing your diet. Arrange a visit with a registered dietitian to answer your questions. Many foods, especially foods high in vitamin K, can interfere with warfarin and affect the PT and INR results. You should eat a consistent amount of foods high in vitamin K. Foods high in vitamin K include:   Spinach, kale, broccoli, cabbage, collard and turnip greens, Brussels sprouts, peas, cauliflower, seaweed, and parsley.  Beef and pork liver.  Green tea.  Soybean oil. Warfarin with Other Medicines Many medicines can interfere with warfarin and affect the PT and INR results. You must:  Tell your health care provider about any and all medicines, vitamins, and supplements you take, including aspirin  and other over-the-counter anti-inflammatory medicines. Be especially cautious with aspirin and anti-inflammatory medicines. Ask your health care provider before taking these.  Do not take or discontinue any prescribed or over-the-counter medicine except on the advice of your health care provider or pharmacist. Warfarin Side Effects Warfarin can have side effects, such as easy bruising and difficulty stopping bleeding. Ask your health care provider or pharmacist about other side effects of warfarin. You will need to:  Hold pressure over cuts for longer than usual.  Notify your dentist and other health care providers that you are taking warfarin before you undergo any procedures where bleeding may occur. Warfarin with Alcohol and Tobacco   Drinking alcohol frequently can increase the effect of warfarin, leading to excess bleeding. It is best to avoid alcoholic drinks or consume only very small amounts while taking warfarin. Notify your health care provider if you change your alcohol intake.  Do not use any tobacco products including cigarettes,  chewing tobacco, or electronic cigarettes. If you smoke, quit. Ask your health care provider for help with quitting smoking. Alternative Medicines to Warfarin: Factor Xa Inhibitor Medicines  These blood thinning medicines are taken by mouth, usually for several weeks or longer. It is important to take the medicine every single day, at the same time each day.  There are no regular blood tests required when using these medicines.  There are fewer food and drug interactions than with warfarin.  The side effects of this class of medicine is similar to that of warfarin, including excessive bruising or bleeding. Ask your health care provider or pharmacist about other potential side effects. SEEK MEDICAL CARE IF:   You notice a rapid heartbeat.  You feel weaker or more tired than usual.  You feel faint.  You notice increased bruising.  Your symptoms are not getting better in the time expected.  You are having side effects of medicine. SEEK IMMEDIATE MEDICAL CARE IF:   You have chest pain.  You have trouble breathing.  You have new or increased swelling or pain in one leg.  You cough up blood.  You notice blood in vomit, in a bowel movement, or in urine.  You have a fever. Symptoms of PE may represent a serious problem that is an emergency. Do not wait to see if the symptoms will go away. Get medical help right away. Call your local emergency services (911 in the [place]). Do not drive yourself to the hospital. Document Released: 12/10/2000 Document Revised: 04/29/2014 Document Reviewed: 12/24/2013 [facility] Patient Information 2015 [place], [place]. This information is not intended to replace advice given to you by your health care provider. Make sure you discuss any questions you have with your health care provider.

## 2015-03-20 LAB — GC/CHLAMYDIA PROBE AMP (~~LOC~~) NOT AT ARMC
Chlamydia: NEGATIVE
Neisseria Gonorrhea: POSITIVE — AB

## 2015-03-20 LAB — HOMOCYSTEINE: Homocysteine: 5.8 umol/L (ref 0.0–15.0)

## 2015-03-20 LAB — LIPOPROTEIN A (LPA): LIPOPROTEIN (A): 12 mg/dL (ref 0–30)

## 2015-03-20 LAB — HIV ANTIBODY (ROUTINE TESTING W REFLEX): HIV Screen 4th Generation wRfx: NONREACTIVE

## 2015-03-21 LAB — ANTIPHOSPHOLIPID SYNDROME EVAL, BLD
Anticardiolipin IgA: <9 APL U/mL (ref 0–11)
Anticardiolipin IgG: <9 GPL U/mL (ref 0–14)
Anticardiolipin IgM: <9 MPL U/mL (ref 0–12)
DRVVT: 37 s (ref 0.0–55.1)
PHOSPHATYDALSERINE, IGA: 1 {APS'U} (ref 0–20)
PHOSPHATYDALSERINE, IGG: 12 {GPS'U} — AB (ref 0–11)
PHOSPHATYDALSERINE, IGM: 11 {MPS'U} (ref 0–25)
PTT LA: 35.3 s (ref 0.0–50.0)

## 2015-03-21 LAB — CULTURE, OB URINE

## 2015-03-21 LAB — FACTOR 8 ASSAY: COAGULATION FACTOR VIII: 154 % — AB (ref 73–140)

## 2015-03-21 LAB — PROTEIN C ACTIVITY: Protein C Activity: 137 % (ref 74–151)

## 2015-03-21 LAB — PROTHROMBIN GENE MUTATION

## 2015-03-25 LAB — FACTOR 5 LEIDEN

## 2015-03-26 ENCOUNTER — Ambulatory Visit: Payer: Self-pay

## 2015-03-26 ENCOUNTER — Telehealth: Payer: Self-pay | Admitting: *Deleted

## 2015-03-26 NOTE — Telephone Encounter (Signed)
Monda missed a scheduled appointment for treatment for +GC culture. Called and spoke to a female that we are calling re: missed appointment- please call clinic. Also put note on her next appointment scheduled for new ob on 04/01/15

## 2015-03-28 ENCOUNTER — Inpatient Hospital Stay (HOSPITAL_COMMUNITY)
Admission: AD | Admit: 2015-03-28 | Discharge: 2015-03-28 | Disposition: A | Discharge status: Home / Self Care | Source: Ambulatory Visit | Attending: Obstetrics & Gynecology | Admitting: Obstetrics & Gynecology

## 2015-03-28 DIAGNOSIS — O4690 Antepartum hemorrhage, unspecified, unspecified trimester: Secondary | ICD-10-CM | POA: Diagnosis not present

## 2015-03-28 LAB — URINALYSIS, ROUTINE W REFLEX MICROSCOPIC
Bilirubin Urine: NEGATIVE
Glucose, UA: NEGATIVE mg/dL
KETONES UR: 15 mg/dL — AB
NITRITE: NEGATIVE
Protein, ur: NEGATIVE mg/dL
UROBILINOGEN UA: 1 mg/dL (ref 0.0–1.0)
pH: 6 (ref 5.0–8.0)

## 2015-03-28 LAB — URINE MICROSCOPIC-ADD ON

## 2015-03-28 NOTE — Progress Notes (Signed)
Not in lobby when called 

## 2015-03-28 NOTE — MAU Note (Signed)
Pt not in lobby.  

## 2015-03-28 NOTE — MAU Note (Signed)
Pt presents to [facility]U with complaints of passing a large blood clot this morning. Denies any pain.

## 2015-04-01 ENCOUNTER — Ambulatory Visit (INDEPENDENT_AMBULATORY_CARE_PROVIDER_SITE_OTHER): Admitting: Obstetrics and Gynecology

## 2015-04-01 ENCOUNTER — Encounter: Payer: Self-pay | Admitting: Obstetrics and Gynecology

## 2015-04-01 VITALS — BP 116/58 | HR 79 | Temp 97.7°F | Wt 154.0 lb

## 2015-04-01 DIAGNOSIS — Z86711 Personal history of pulmonary embolism: Secondary | ICD-10-CM

## 2015-04-01 DIAGNOSIS — Z3402 Encounter for supervision of normal first pregnancy, second trimester: Secondary | ICD-10-CM

## 2015-04-01 DIAGNOSIS — O98212 Gonorrhea complicating pregnancy, second trimester: Secondary | ICD-10-CM

## 2015-04-01 DIAGNOSIS — Z3400 Encounter for supervision of normal first pregnancy, unspecified trimester: Secondary | ICD-10-CM | POA: Insufficient documentation

## 2015-04-01 DIAGNOSIS — O99332 Smoking (tobacco) complicating pregnancy, second trimester: Secondary | ICD-10-CM

## 2015-04-01 DIAGNOSIS — O98219 Gonorrhea complicating pregnancy, unspecified trimester: Secondary | ICD-10-CM | POA: Insufficient documentation

## 2015-04-01 DIAGNOSIS — Z23 Encounter for immunization: Secondary | ICD-10-CM

## 2015-04-01 DIAGNOSIS — O99330 Smoking (tobacco) complicating pregnancy, unspecified trimester: Secondary | ICD-10-CM | POA: Insufficient documentation

## 2015-04-01 LAB — POCT URINALYSIS DIP (DEVICE)
Bilirubin Urine: NEGATIVE
GLUCOSE, UA: NEGATIVE mg/dL
Hgb urine dipstick: NEGATIVE
Ketones, ur: NEGATIVE mg/dL
NITRITE: POSITIVE — AB
Protein, ur: NEGATIVE mg/dL
Specific Gravity, Urine: 1.025 (ref 1.005–1.030)
UROBILINOGEN UA: 1 mg/dL (ref 0.0–1.0)
pH: 7 (ref 5.0–8.0)

## 2015-04-01 MED ORDER — CEFTRIAXONE SODIUM 1 G IJ SOLR
250.0000 mg | Freq: Once | INTRAMUSCULAR | Status: AC
  Administered 2015-04-01: 250 mg via INTRAMUSCULAR

## 2015-04-01 MED ORDER — AZITHROMYCIN 250 MG PO TABS
1000.0000 mg | ORAL_TABLET | Freq: Once | ORAL | Status: AC
  Administered 2015-04-01: 1000 mg via ORAL

## 2015-04-01 NOTE — Progress Notes (Signed)
Initial visit.  Initial labs and treatment for GC today.  Small leuks and nitrates in urine.  New OB packet given.

## 2015-04-01 NOTE — Patient Instructions (Signed)
Second Trimester of Pregnancy The second trimester is from week 13 through week 28, months 4 through 6. The second trimester is often a time when you feel your best. Your body has also adjusted to being pregnant, and you begin to feel better physically. Usually, morning sickness has lessened or quit completely, you may have more energy, and you may have an increase in appetite. The second trimester is also a time when the fetus is growing rapidly. At the end of the sixth month, the fetus is about 9 inches long and weighs about 1 pounds. You will likely begin to feel the baby move (quickening) between 18 and 20 weeks of the pregnancy. BODY CHANGES Your body goes through many changes during pregnancy. The changes vary from woman to woman.   Your weight will continue to increase. You will notice your lower abdomen bulging out.  You may begin to get stretch marks on your hips, abdomen, and breasts.  You may develop headaches that can be relieved by medicines approved by your health care provider.  You may urinate more often because the fetus is pressing on your bladder.  You may develop or continue to have heartburn as a result of your pregnancy.  You may develop constipation because certain hormones are causing the muscles that push waste through your intestines to slow down.  You may develop hemorrhoids or swollen, bulging veins (varicose veins).  You may have back pain because of the weight gain and pregnancy hormones relaxing your joints between the bones in your pelvis and as a result of a shift in weight and the muscles that support your balance.  Your breasts will continue to grow and be tender.  Your gums may bleed and may be sensitive to brushing and flossing.  Dark spots or blotches (chloasma, mask of pregnancy) may develop on your face. This will likely fade after the baby is born.  A dark line from your belly button to the pubic area (linea nigra) may appear. This will likely  fade after the baby is born.  You may have changes in your hair. These can include thickening of your hair, rapid growth, and changes in texture. Some women also have hair loss during or after pregnancy, or hair that feels dry or thin. Your hair will most likely return to normal after your baby is born. WHAT TO EXPECT AT YOUR PRENATAL VISITS During a routine prenatal visit:  You will be weighed to make sure you and the fetus are growing normally.  Your blood pressure will be taken.  Your abdomen will be measured to track your baby's growth.  The fetal heartbeat will be listened to.  Any test results from the previous visit will be discussed. Your health care provider may ask you:  How you are feeling.  If you are feeling the baby move.  If you have had any abnormal symptoms, such as leaking fluid, bleeding, severe headaches, or abdominal cramping.  If you have any questions. Other tests that may be performed during your second trimester include:  Blood tests that check for:  Low iron levels (anemia).  Gestational diabetes (between 24 and 28 weeks).  Rh antibodies.  Urine tests to check for infections, diabetes, or protein in the urine.  An ultrasound to confirm the proper growth and development of the baby.  An amniocentesis to check for possible genetic problems.  Fetal screens for spina bifida and Down syndrome. HOME CARE INSTRUCTIONS   Avoid all smoking, herbs, alcohol, and unprescribed   drugs. These chemicals affect the formation and growth of the baby.  Follow your health care provider's instructions regarding medicine use. There are medicines that are either safe or unsafe to take during pregnancy.  Exercise only as directed by your health care provider. Experiencing uterine cramps is a good sign to stop exercising.  Continue to eat regular, healthy meals.  Wear a good support bra for breast tenderness.  Do not use hot tubs, steam rooms, or saunas.  Wear  your seat belt at all times when driving.  Avoid raw meat, uncooked cheese, cat litter boxes, and soil used by cats. These carry germs that can cause birth defects in the baby.  Take your prenatal vitamins.  Try taking a stool softener (if your health care provider approves) if you develop constipation. Eat more high-fiber foods, such as fresh vegetables or fruit and whole grains. Drink plenty of fluids to keep your urine clear or pale yellow.  Take warm sitz baths to soothe any pain or discomfort caused by hemorrhoids. Use hemorrhoid cream if your health care provider approves.  If you develop varicose veins, wear support hose. Elevate your feet for 15 minutes, 3-4 times a day. Limit salt in your diet.  Avoid heavy lifting, wear low heel shoes, and practice good posture.  Rest with your legs elevated if you have leg cramps or low back pain.  Visit your dentist if you have not gone yet during your pregnancy. Use a soft toothbrush to brush your teeth and be gentle when you floss.  A sexual relationship may be continued unless your health care provider directs you otherwise.  Continue to go to all your prenatal visits as directed by your health care provider. SEEK MEDICAL CARE IF:   You have dizziness.  You have mild pelvic cramps, pelvic pressure, or nagging pain in the abdominal area.  You have persistent nausea, vomiting, or diarrhea.  You have a bad smelling vaginal discharge.  You have pain with urination. SEEK IMMEDIATE MEDICAL CARE IF:   You have a fever.  You are leaking fluid from your vagina.  You have spotting or bleeding from your vagina.  You have severe abdominal cramping or pain.  You have rapid weight gain or loss.  You have shortness of breath with chest pain.  You notice sudden or extreme swelling of your face, hands, ankles, feet, or legs.  You have not felt your baby move in over an hour.  You have severe headaches that do not go away with  medicine.  You have vision changes. Document Released: 12/07/2001 Document Revised: 12/18/2013 Document Reviewed: 02/13/2013 [employer] Patient Information 2015 [employer]. This information is not intended to replace advice given to you by your health care provider. Make sure you discuss any questions you have with your health care provider.  Contraception Choices Contraception (birth control) is the use of any methods or devices to prevent pregnancy. Below are some methods to help avoid pregnancy. HORMONAL METHODS   Contraceptive implant. This is a thin, plastic tube containing progesterone hormone. It does not contain estrogen hormone. Your health care provider inserts the tube in the inner part of the upper arm. The tube can remain in place for up to 3 years. After 3 years, the implant must be removed. The implant prevents the ovaries from releasing an egg (ovulation), thickens the cervical mucus to prevent sperm from entering the uterus, and thins the lining of the inside of the uterus.  Progesterone-only injections. These injections are given   every 3 months by your health care provider to prevent pregnancy. This synthetic progesterone hormone stops the ovaries from releasing eggs. It also thickens cervical mucus and changes the uterine lining. This makes it harder for sperm to survive in the uterus.  Birth control pills. These pills contain estrogen and progesterone hormone. They work by preventing the ovaries from releasing eggs (ovulation). They also cause the cervical mucus to thicken, preventing the sperm from entering the uterus. Birth control pills are prescribed by a health care provider.Birth control pills can also be used to treat heavy periods.  Minipill. This type of birth control pill contains only the progesterone hormone. They are taken every day of each month and must be prescribed by your health care provider.  Birth control patch. The patch contains hormones similar to  those in birth control pills. It must be changed once a week and is prescribed by a health care provider.  Vaginal ring. The ring contains hormones similar to those in birth control pills. It is left in the vagina for 3 weeks, removed for 1 week, and then a new one is put back in place. The patient must be comfortable inserting and removing the ring from the vagina.A health care provider's prescription is necessary.  Emergency contraception. Emergency contraceptives prevent pregnancy after unprotected sexual intercourse. This pill can be taken right after sex or up to 5 days after unprotected sex. It is most effective the sooner you take the pills after having sexual intercourse. Most emergency contraceptive pills are available without a prescription. Check with your pharmacist. Do not use emergency contraception as your only form of birth control. BARRIER METHODS   Female condom. This is a thin sheath (latex or rubber) that is worn over the penis during sexual intercourse. It can be used with spermicide to increase effectiveness.  Female condom. This is a soft, loose-fitting sheath that is put into the vagina before sexual intercourse.  Diaphragm. This is a soft, latex, dome-shaped barrier that must be fitted by a health care provider. It is inserted into the vagina, along with a spermicidal jelly. It is inserted before intercourse. The diaphragm should be left in the vagina for 6 to 8 hours after intercourse.  Cervical cap. This is a round, soft, latex or plastic cup that fits over the cervix and must be fitted by a health care provider. The cap can be left in place for up to 48 hours after intercourse.  Sponge. This is a soft, circular piece of polyurethane foam. The sponge has spermicide in it. It is inserted into the vagina after wetting it and before sexual intercourse.  Spermicides. These are chemicals that kill or block sperm from entering the cervix and uterus. They come in the form of  creams, jellies, suppositories, foam, or tablets. They do not require a prescription. They are inserted into the vagina with an applicator before having sexual intercourse. The process must be repeated every time you have sexual intercourse. INTRAUTERINE CONTRACEPTION  Intrauterine device (IUD). This is a T-shaped device that is put in a woman's uterus during a menstrual period to prevent pregnancy. There are 2 types:  Copper IUD. This type of IUD is wrapped in copper wire and is placed inside the uterus. Copper makes the uterus and fallopian tubes produce a fluid that kills sperm. It can stay in place for 10 years.  Hormone IUD. This type of IUD contains the hormone progestin (synthetic progesterone). The hormone thickens the cervical mucus and prevents sperm from   entering the uterus, and it also thins the uterine lining to prevent implantation of a fertilized egg. The hormone can weaken or kill the sperm that get into the uterus. It can stay in place for 3-5 years, depending on which type of IUD is used. PERMANENT METHODS OF CONTRACEPTION  Female tubal ligation. This is when the woman's fallopian tubes are surgically sealed, tied, or blocked to prevent the egg from traveling to the uterus.  Hysteroscopic sterilization. This involves placing a small coil or insert into each fallopian tube. Your doctor uses a technique called hysteroscopy to do the procedure. The device causes scar tissue to form. This results in permanent blockage of the fallopian tubes, so the sperm cannot fertilize the egg. It takes about 3 months after the procedure for the tubes to become blocked. You must use another form of birth control for these 3 months.  Female sterilization. This is when the female has the tubes that carry sperm tied off (vasectomy).This blocks sperm from entering the vagina during sexual intercourse. After the procedure, the man can still ejaculate fluid (semen). NATURAL PLANNING METHODS  Natural family  planning. This is not having sexual intercourse or using a barrier method (condom, diaphragm, cervical cap) on days the woman could become pregnant.  Calendar method. This is keeping track of the length of each menstrual cycle and identifying when you are fertile.  Ovulation method. This is avoiding sexual intercourse during ovulation.  Symptothermal method. This is avoiding sexual intercourse during ovulation, using a thermometer and ovulation symptoms.  Post-ovulation method. This is timing sexual intercourse after you have ovulated. Regardless of which type or method of contraception you choose, it is important that you use condoms to protect against the transmission of sexually transmitted infections (STIs). Talk with your health care provider about which form of contraception is most appropriate for you. Document Released: 12/13/2005 Document Revised: 12/18/2013 Document Reviewed: 06/07/2013 [employer] Patient Information 2015 [employer]. This information is not intended to replace advice given to you by your health care provider. Make sure you discuss any questions you have with your health care provider.  Breastfeeding Deciding to breastfeed is one of the best choices you can make for you and your baby. A change in hormones during pregnancy causes your breast tissue to grow and increases the number and size of your milk ducts. These hormones also allow proteins, sugars, and fats from your blood supply to make breast milk in your milk-producing glands. Hormones prevent breast milk from being released before your baby is born as well as prompt milk flow after birth. Once breastfeeding has begun, thoughts of your baby, as well as his or her sucking or crying, can stimulate the release of milk from your milk-producing glands.  BENEFITS OF BREASTFEEDING For Your Baby  Your first milk (colostrum) helps your baby's digestive system function better.   There are antibodies in your milk that  help your baby fight off infections.   Your baby has a lower incidence of asthma, allergies, and sudden infant death syndrome.   The nutrients in breast milk are better for your baby than infant formulas and are designed uniquely for your baby's needs.   Breast milk improves your baby's brain development.   Your baby is less likely to develop other conditions, such as childhood obesity, asthma, or type 2 diabetes mellitus.  For You   Breastfeeding helps to create a very special bond between you and your baby.   Breastfeeding is convenient. Breast milk is   always available at the correct temperature and costs nothing.   Breastfeeding helps to burn calories and helps you lose the weight gained during pregnancy.   Breastfeeding makes your uterus contract to its prepregnancy size faster and slows bleeding (lochia) after you give birth.   Breastfeeding helps to lower your risk of developing type 2 diabetes mellitus, osteoporosis, and breast or ovarian cancer later in life. SIGNS THAT YOUR BABY IS HUNGRY Early Signs of Hunger  Increased alertness or activity.  Stretching.  Movement of the head from side to side.  Movement of the head and opening of the mouth when the corner of the mouth or cheek is stroked (rooting).  Increased sucking sounds, smacking lips, cooing, sighing, or squeaking.  Hand-to-mouth movements.  Increased sucking of fingers or hands. Late Signs of Hunger  Fussing.  Intermittent crying. Extreme Signs of Hunger Signs of extreme hunger will require calming and consoling before your baby will be able to breastfeed successfully. Do not wait for the following signs of extreme hunger to occur before you initiate breastfeeding:   Restlessness.  A loud, strong cry.   Screaming. BREASTFEEDING BASICS Breastfeeding Initiation  Find a comfortable place to sit or lie down, with your neck and back well supported.  Place a pillow or rolled up blanket  under your baby to bring him or her to the level of your breast (if you are seated). Nursing pillows are specially designed to help support your arms and your baby while you breastfeed.  Make sure that your baby's abdomen is facing your abdomen.   Gently massage your breast. With your fingertips, massage from your chest wall toward your nipple in a circular motion. This encourages milk flow. You may need to continue this action during the feeding if your milk flows slowly.  Support your breast with 4 fingers underneath and your thumb above your nipple. Make sure your fingers are well away from your nipple and your baby's mouth.   Stroke your baby's lips gently with your finger or nipple.   When your baby's mouth is open wide enough, quickly bring your baby to your breast, placing your entire nipple and as much of the colored area around your nipple (areola) as possible into your baby's mouth.   More areola should be visible above your baby's upper lip than below the lower lip.   Your baby's tongue should be between his or her lower gum and your breast.   Ensure that your baby's mouth is correctly positioned around your nipple (latched). Your baby's lips should create a seal on your breast and be turned out (everted).  It is common for your baby to suck about 2-3 minutes in order to start the flow of breast milk. Latching Teaching your baby how to latch on to your breast properly is very important. An improper latch can cause nipple pain and decreased milk supply for you and poor weight gain in your baby. Also, if your baby is not latched onto your nipple properly, he or she may swallow some air during feeding. This can make your baby fussy. Burping your baby when you switch breasts during the feeding can help to get rid of the air. However, teaching your baby to latch on properly is still the best way to prevent fussiness from swallowing air while breastfeeding. Signs that your baby has  successfully latched on to your nipple:    Silent tugging or silent sucking, without causing you pain.   Swallowing heard between every 3-4   sucks.    Muscle movement above and in front of his or her ears while sucking.  Signs that your baby has not successfully latched on to nipple:   Sucking sounds or smacking sounds from your baby while breastfeeding.  Nipple pain. If you think your baby has not latched on correctly, slip your finger into the corner of your baby's mouth to break the suction and place it between your baby's gums. Attempt breastfeeding initiation again. Signs of Successful Breastfeeding Signs from your baby:   A gradual decrease in the number of sucks or complete cessation of sucking.   Falling asleep.   Relaxation of his or her body.   Retention of a small amount of milk in his or her mouth.   Letting go of your breast by himself or herself. Signs from you:  Breasts that have increased in firmness, weight, and size 1-3 hours after feeding.   Breasts that are softer immediately after breastfeeding.  Increased milk volume, as well as a change in milk consistency and color by the fifth day of breastfeeding.   Nipples that are not sore, cracked, or bleeding. Signs That Your Baby is Getting Enough Milk  Wetting at least 3 diapers in a 24-hour period. The urine should be clear and pale yellow by age 5 days.  At least 3 stools in a 24-hour period by age 5 days. The stool should be soft and yellow.  At least 3 stools in a 24-hour period by age 7 days. The stool should be seedy and yellow.  No loss of weight greater than 10% of birth weight during the first 3 days of age.  Average weight gain of 4-7 ounces (113-198 g) per week after age 4 days.  Consistent daily weight gain by age 5 days, without weight loss after the age of 2 weeks. After a feeding, your baby may spit up a small amount. This is common. BREASTFEEDING FREQUENCY AND DURATION Frequent  feeding will help you make more milk and can prevent sore nipples and breast engorgement. Breastfeed when you feel the need to reduce the fullness of your breasts or when your baby shows signs of hunger. This is called "breastfeeding on demand." Avoid introducing a pacifier to your baby while you are working to establish breastfeeding (the first 4-6 weeks after your baby is born). After this time you may choose to use a pacifier. Research has shown that pacifier use during the first year of a baby's life decreases the risk of sudden infant death syndrome (SIDS). Allow your baby to feed on each breast as long as he or she wants. Breastfeed until your baby is finished feeding. When your baby unlatches or falls asleep while feeding from the first breast, offer the second breast. Because newborns are often sleepy in the first few weeks of life, you may need to awaken your baby to get him or her to feed. Breastfeeding times will vary from baby to baby. However, the following rules can serve as a guide to help you ensure that your baby is properly fed:  Newborns (babies 4 weeks of age or younger) may breastfeed every 1-3 hours.  Newborns should not go longer than 3 hours during the day or 5 hours during the night without breastfeeding.  You should breastfeed your baby a minimum of 8 times in a 24-hour period until you begin to introduce solid foods to your baby at around 6 months of age. BREAST MILK PUMPING Pumping and storing breast milk allows   you to ensure that your baby is exclusively fed your breast milk, even at times when you are unable to breastfeed. This is especially important if you are going back to work while you are still breastfeeding or when you are not able to be present during feedings. Your lactation consultant can give you guidelines on how long it is safe to store breast milk.  A breast pump is a machine that allows you to pump milk from your breast into a sterile bottle. The pumped breast  milk can then be stored in a refrigerator or freezer. Some breast pumps are operated by hand, while others use electricity. Ask your lactation consultant which type will work best for you. Breast pumps can be purchased, but some hospitals and breastfeeding support groups lease breast pumps on a monthly basis. A lactation consultant can teach you how to hand express breast milk, if you prefer not to use a pump.  CARING FOR YOUR BREASTS WHILE YOU BREASTFEED Nipples can become dry, cracked, and sore while breastfeeding. The following recommendations can help keep your breasts moisturized and healthy:  Avoid using soap on your nipples.   Wear a supportive bra. Although not required, special nursing bras and tank tops are designed to allow access to your breasts for breastfeeding without taking off your entire bra or top. Avoid wearing underwire-style bras or extremely tight bras.  Air dry your nipples for 3-4minutes after each feeding.   Use only cotton bra pads to absorb leaked breast milk. Leaking of breast milk between feedings is normal.   Use lanolin on your nipples after breastfeeding. Lanolin helps to maintain your skin's normal moisture barrier. If you use pure lanolin, you do not need to wash it off before feeding your baby again. Pure lanolin is not toxic to your baby. You may also hand express a few drops of breast milk and gently massage that milk into your nipples and allow the milk to air dry. In the first few weeks after giving birth, some women experience extremely full breasts (engorgement). Engorgement can make your breasts feel heavy, warm, and tender to the touch. Engorgement peaks within 3-5 days after you give birth. The following recommendations can help ease engorgement:  Completely empty your breasts while breastfeeding or pumping. You may want to start by applying warm, moist heat (in the shower or with warm water-soaked hand towels) just before feeding or pumping. This  increases circulation and helps the milk flow. If your baby does not completely empty your breasts while breastfeeding, pump any extra milk after he or she is finished.  Wear a snug bra (nursing or regular) or tank top for 1-2 days to signal your body to slightly decrease milk production.  Apply ice packs to your breasts, unless this is too uncomfortable for you.  Make sure that your baby is latched on and positioned properly while breastfeeding. If engorgement persists after 48 hours of following these recommendations, contact your health care provider or a lactation consultant. OVERALL HEALTH CARE RECOMMENDATIONS WHILE BREASTFEEDING  Eat healthy foods. Alternate between meals and snacks, eating 3 of each per day. Because what you eat affects your breast milk, some of the foods may make your baby more irritable than usual. Avoid eating these foods if you are sure that they are negatively affecting your baby.  Drink milk, fruit juice, and water to satisfy your thirst (about 10 glasses a day).   Rest often, relax, and continue to take your prenatal vitamins to prevent fatigue,   stress, and anemia.  Continue breast self-awareness checks.  Avoid chewing and smoking tobacco.  Avoid alcohol and drug use. Some medicines that may be harmful to your baby can pass through breast milk. It is important to ask your health care provider before taking any medicine, including all over-the-counter and prescription medicine as well as vitamin and herbal supplements. It is possible to become pregnant while breastfeeding. If birth control is desired, ask your health care provider about options that will be safe for your baby. SEEK MEDICAL CARE IF:   You feel like you want to stop breastfeeding or have become frustrated with breastfeeding.  You have painful breasts or nipples.  Your nipples are cracked or bleeding.  Your breasts are red, tender, or warm.  You have a swollen area on either breast.  You  have a fever or chills.  You have nausea or vomiting.  You have drainage other than breast milk from your nipples.  Your breasts do not become full before feedings by the fifth day after you give birth.  You feel sad and depressed.  Your baby is too sleepy to eat well.  Your baby is having trouble sleeping.   Your baby is wetting less than 3 diapers in a 24-hour period.  Your baby has less than 3 stools in a 24-hour period.  Your baby's skin or the white part of his or her eyes becomes yellow.   Your baby is not gaining weight by 5 days of age. SEEK IMMEDIATE MEDICAL CARE IF:   Your baby is overly tired (lethargic) and does not want to wake up and feed.  Your baby develops an unexplained fever. Document Released: 12/13/2005 Document Revised: 12/18/2013 Document Reviewed: 06/06/2013 ExitCare Patient Information 2015 [employer]. This information is not intended to replace advice given to you by your health care provider. Make sure you discuss any questions you have with your health care provider.  

## 2015-04-01 NOTE — Addendum Note (Signed)
Addended by: Louanna RawAMPBELL, Jenniefer Salak M on: 04/01/2015 01:37 PM   Modules accepted: Orders

## 2015-04-01 NOTE — Progress Notes (Signed)
   Subjective:    Helen Blackburn is a G2P0010 6319w1d being seen today for her first obstetrical visit.  Her obstetrical history is significant for history of PE while on OCP, smoker and teen pregnancy, gonorrhea infection in second trimester. Patient does intend to breast feed. Pregnancy history fully reviewed.  Patient reports no complaints.  Filed Vitals:   04/01/15 1253  BP: 116/58  Pulse: 79  Temp: 97.7 F (36.5 C)  Weight: 154 lb (69.854 kg)    HISTORY: OB History  Gravida Para Term Preterm AB SAB TAB Ectopic Multiple Living  2    1 1     0    # Outcome Date GA Lbr Len/2nd Weight Sex Delivery Anes PTL Lv  2 Current           1 SAB              Past Medical History  Diagnosis Date  . Pulmonary embolism on left   . Schizoaffective disorder   . Bipolar affective disorder 04/24/2014  . Anxiety   . Bipolar 1 disorder   . Depression   . Asthma   . Headache   . Chronic kidney disease     frequent UTI   History reviewed. No pertinent past surgical history. Family History  Problem Relation Age of Onset  . Heart attack Father   . Heart disease Father   . Hypertension Father   . Anxiety disorder Mother   . Cancer Maternal Grandmother   . Stroke Paternal Grandmother      Exam    Uterus:     Pelvic Exam:    Perineum: Normal Perineum   Vulva: normal   Vagina:  normal mucosa, normal discharge   pH:    Cervix: closed and long   Adnexa: not evaluated   Bony Pelvis: gynecoid  System: Breast:  normal appearance, no masses or tenderness   Skin: normal coloration and turgor, no rashes    Neurologic: oriented, no focal deficits   Extremities: normal strength, tone, and muscle mass   HEENT extra ocular movement intact   Mouth/Teeth mucous membranes moist, pharynx normal without lesions and dental hygiene good   Neck supple and no masses   Cardiovascular: regular rate and rhythm   Respiratory:  chest clear, no wheezing, crepitations, rhonchi, normal symmetric air entry    Abdomen: soft, gravid   Urinary:       Assessment:    Pregnancy: G2P0010 Patient Active Problem List   Diagnosis Date Noted  . Supervision of normal first pregnancy 04/01/2015    Priority: High  . Tobacco smoking affecting pregnancy, antepartum 04/01/2015    Priority: Medium  . Gonorrhea in pregnancy, antepartum 04/01/2015  . Major depression 04/25/2014  . Overdose 04/24/2014  . Bipolar affective disorder 04/24/2014  . Hypotension 04/24/2014  . Acute encephalopathy 04/24/2014  . Schizoaffective disorder   . Suicidal ideations 01/24/2014        Plan:     Initial labs drawn. Prenatal vitamins. Problem list reviewed and updated. Genetic Screening discussed Quad Screen: ordered.  Ultrasound discussed; fetal survey: ordered.  Follow up in 4 weeks. 50% of 30 min visit spent on counseling and coordination of care.     Guy Toney 04/01/2015

## 2015-04-02 ENCOUNTER — Encounter: Payer: Self-pay | Admitting: Obstetrics and Gynecology

## 2015-04-02 DIAGNOSIS — Z6791 Unspecified blood type, Rh negative: Secondary | ICD-10-CM | POA: Insufficient documentation

## 2015-04-02 DIAGNOSIS — O26899 Other specified pregnancy related conditions, unspecified trimester: Secondary | ICD-10-CM

## 2015-04-02 LAB — PRENATAL PROFILE (SOLSTAS)
ANTIBODY SCREEN: NEGATIVE
Basophils Absolute: 0 10*3/uL (ref 0.0–0.1)
Basophils Relative: 0 % (ref 0–1)
EOS PCT: 1 % (ref 0–5)
Eosinophils Absolute: 0.1 10*3/uL (ref 0.0–0.7)
HCT: 33.4 % — ABNORMAL LOW (ref 36.0–46.0)
HIV 1&2 Ab, 4th Generation: NONREACTIVE
Hemoglobin: 11.5 g/dL — ABNORMAL LOW (ref 12.0–15.0)
Hepatitis B Surface Ag: NEGATIVE
LYMPHS PCT: 28 % (ref 12–46)
Lymphs Abs: 2.3 10*3/uL (ref 0.7–4.0)
MCH: 32.1 pg (ref 26.0–34.0)
MCHC: 34.4 g/dL (ref 30.0–36.0)
MCV: 93.3 fL (ref 78.0–100.0)
MPV: 9.8 fL (ref 8.6–12.4)
Monocytes Absolute: 0.4 10*3/uL (ref 0.1–1.0)
Monocytes Relative: 5 % (ref 3–12)
NEUTROS ABS: 5.3 10*3/uL (ref 1.7–7.7)
Neutrophils Relative %: 66 % (ref 43–77)
PLATELETS: 226 10*3/uL (ref 150–400)
RBC: 3.58 MIL/uL — AB (ref 3.87–5.11)
RDW: 14.1 % (ref 11.5–15.5)
RUBELLA: 1.92 {index} — AB (ref <0.90)
Rh Type: NEGATIVE
WBC: 8.1 10*3/uL (ref 4.0–10.5)

## 2015-04-03 ENCOUNTER — Telehealth: Payer: Self-pay

## 2015-04-03 LAB — CULTURE, OB URINE: Colony Count: >=100000

## 2015-04-03 MED ORDER — CEPHALEXIN 500 MG PO CAPS: 500.0000 mg | ORAL_CAPSULE | Freq: Four times a day (QID) | ORAL | Status: DC

## 2015-04-03 NOTE — Telephone Encounter (Signed)
-----   Message from Catalina AntiguaPeggy Constant, MD sent at 04/03/2015 11:51 AM EDT ----- Please inform the patient of positive UTI. Rx for keflex has been e-prescribed  AnimatorThanks  Peggy

## 2015-04-03 NOTE — Addendum Note (Signed)
Addended by: Catalina AntiguaONSTANT, Colletta Spillers on: 04/03/2015 11:50 AM   Modules accepted: Orders

## 2015-04-03 NOTE — Telephone Encounter (Signed)
Called patient. No answer. Left message on mobile voicemail informing her of ABX sent to [facility] pharmacy for UTI, call clinic with questions. Called home phone-- continued to ring. Unable to leave message. Will also send letter.

## 2015-04-06 LAB — CANNABANOIDS (GC/LC/MS), URINE: THC-COOH UR CONFIRM: 545 ng/mL — AB (ref <5)

## 2015-04-08 LAB — AFP, QUAD SCREEN
AFP: 41.1 ng/mL
CURR GEST AGE: 17.1 wks.days
HCG, Total: 28.31 IU/mL
INH: 360.9 pg/mL
Interpretation-AFP: NEGATIVE
MOM FOR HCG: 0.91
MOM FOR INH: 2.18
MoM for AFP: 1.06
Open Spina bifida: NEGATIVE
TRI 18 SCR RISK EST: NEGATIVE
Trisomy 18 (Edward) Syndrome Interp.: 1:82300 {titer}
uE3 Mom: 1.01
uE3 Value: 1.04 ng/mL

## 2015-04-08 LAB — PRESCRIPTION MONITORING PROFILE (19 PANEL)
Amphetamine/Meth: NEGATIVE ng/mL
BARBITURATE SCREEN, URINE: NEGATIVE ng/mL
BENZODIAZEPINE SCREEN, URINE: NEGATIVE ng/mL
Buprenorphine, Urine: NEGATIVE ng/mL
Carisoprodol, Urine: NEGATIVE ng/mL
Cocaine Metabolites: NEGATIVE ng/mL
Creatinine, Urine: 175.69 mg/dL (ref >20.0)
ECSTASY: NEGATIVE ng/mL
Fentanyl, Ur: NEGATIVE ng/mL
METHAQUALONE SCREEN (URINE): NEGATIVE ng/mL
Meperidine, Ur: NEGATIVE ng/mL
Methadone Screen, Urine: NEGATIVE ng/mL
NITRITES URINE, INITIAL: NEGATIVE ug/mL
OPIATE SCREEN, URINE: NEGATIVE ng/mL
Oxycodone Screen, Ur: NEGATIVE ng/mL
PROPOXYPHENE: NEGATIVE ng/mL
Phencyclidine, Ur: NEGATIVE ng/mL
TAPENTADOLUR: NEGATIVE ng/mL
TRAMADOL UR: NEGATIVE ng/mL
Zolpidem, Urine: NEGATIVE ng/mL
pH, Initial: 7.7 pH (ref 4.5–8.9)

## 2015-04-15 ENCOUNTER — Other Ambulatory Visit (HOSPITAL_COMMUNITY): Payer: Self-pay | Admitting: Advanced Practice Midwife

## 2015-04-15 ENCOUNTER — Ambulatory Visit (HOSPITAL_COMMUNITY)
Admission: RE | Admit: 2015-04-15 | Discharge: 2015-04-15 | Disposition: A | Discharge status: Home / Self Care | Source: Ambulatory Visit | Attending: Advanced Practice Midwife | Admitting: Advanced Practice Midwife

## 2015-04-15 DIAGNOSIS — F17210 Nicotine dependence, cigarettes, uncomplicated: Secondary | ICD-10-CM | POA: Diagnosis not present

## 2015-04-15 DIAGNOSIS — Z36 Encounter for antenatal screening of mother: Secondary | ICD-10-CM | POA: Insufficient documentation

## 2015-04-15 DIAGNOSIS — Z3689 Encounter for other specified antenatal screening: Secondary | ICD-10-CM | POA: Insufficient documentation

## 2015-04-15 DIAGNOSIS — O0932 Supervision of pregnancy with insufficient antenatal care, second trimester: Secondary | ICD-10-CM

## 2015-04-15 DIAGNOSIS — Z3A19 19 weeks gestation of pregnancy: Secondary | ICD-10-CM | POA: Insufficient documentation

## 2015-04-15 DIAGNOSIS — O99332 Smoking (tobacco) complicating pregnancy, second trimester: Secondary | ICD-10-CM | POA: Diagnosis not present

## 2015-04-29 ENCOUNTER — Encounter: Payer: Self-pay | Admitting: Obstetrics and Gynecology

## 2015-04-29 ENCOUNTER — Telehealth: Payer: Self-pay | Admitting: Obstetrics and Gynecology

## 2015-04-29 NOTE — Telephone Encounter (Signed)
Called left message for patient on both numbers in patient's demographics. Mailing certified letter to patient.

## 2015-05-05 ENCOUNTER — Other Ambulatory Visit (HOSPITAL_COMMUNITY): Payer: Self-pay | Admitting: Advanced Practice Midwife

## 2015-05-05 ENCOUNTER — Inpatient Hospital Stay (HOSPITAL_COMMUNITY)
Admission: AD | Admit: 2015-05-05 | Discharge: 2015-05-05 | Disposition: A | Discharge status: Home / Self Care | Source: Ambulatory Visit | Attending: Obstetrics and Gynecology | Admitting: Obstetrics and Gynecology

## 2015-05-05 ENCOUNTER — Encounter (HOSPITAL_COMMUNITY): Payer: Self-pay | Admitting: *Deleted

## 2015-05-05 DIAGNOSIS — F17210 Nicotine dependence, cigarettes, uncomplicated: Secondary | ICD-10-CM | POA: Insufficient documentation

## 2015-05-05 DIAGNOSIS — O99332 Smoking (tobacco) complicating pregnancy, second trimester: Secondary | ICD-10-CM | POA: Diagnosis not present

## 2015-05-05 DIAGNOSIS — O98912 Unspecified maternal infectious and parasitic disease complicating pregnancy, second trimester: Secondary | ICD-10-CM

## 2015-05-05 DIAGNOSIS — O99512 Diseases of the respiratory system complicating pregnancy, second trimester: Secondary | ICD-10-CM | POA: Insufficient documentation

## 2015-05-05 DIAGNOSIS — J069 Acute upper respiratory infection, unspecified: Secondary | ICD-10-CM | POA: Diagnosis not present

## 2015-05-05 DIAGNOSIS — Z3A22 22 weeks gestation of pregnancy: Secondary | ICD-10-CM | POA: Diagnosis not present

## 2015-05-05 DIAGNOSIS — R109 Unspecified abdominal pain: Secondary | ICD-10-CM | POA: Diagnosis not present

## 2015-05-05 DIAGNOSIS — Z86711 Personal history of pulmonary embolism: Secondary | ICD-10-CM | POA: Insufficient documentation

## 2015-05-05 DIAGNOSIS — R05 Cough: Secondary | ICD-10-CM | POA: Diagnosis present

## 2015-05-05 LAB — URINALYSIS, ROUTINE W REFLEX MICROSCOPIC
BILIRUBIN URINE: NEGATIVE
GLUCOSE, UA: NEGATIVE mg/dL
HGB URINE DIPSTICK: NEGATIVE
KETONES UR: NEGATIVE mg/dL
Nitrite: NEGATIVE
PH: 7 (ref 5.0–8.0)
PROTEIN: NEGATIVE mg/dL
Specific Gravity, Urine: 1.02 (ref 1.005–1.030)
Urobilinogen, UA: 0.2 mg/dL (ref 0.0–1.0)

## 2015-05-05 LAB — URINE MICROSCOPIC-ADD ON

## 2015-05-05 MED ORDER — METRONIDAZOLE 500 MG PO TABS: 500.0000 mg | ORAL_TABLET | Freq: Two times a day (BID) | ORAL | Status: DC

## 2015-05-05 MED ORDER — AMOXICILLIN-POT CLAVULANATE 875-125 MG PO TABS: 1.0000 | ORAL_TABLET | Freq: Two times a day (BID) | ORAL | Status: DC

## 2015-05-05 MED ORDER — HYDROCOD POLST-CPM POLST ER 10-8 MG/5ML PO SUER: 5.0000 mL | Freq: Two times a day (BID) | ORAL | Status: DC

## 2015-05-05 MED ORDER — AMOXICILLIN-POT CLAVULANATE 875-125 MG PO TABS
1.0000 | ORAL_TABLET | Freq: Once | ORAL | Status: AC
  Administered 2015-05-05: 1 via ORAL
  Filled 2015-05-05: qty 1

## 2015-05-05 MED ORDER — HYDROCOD POLST-CPM POLST ER 10-8 MG/5ML PO SUER
5.0000 mL | Freq: Once | ORAL | Status: AC
  Administered 2015-05-05: 5 mL via ORAL
  Filled 2015-05-05: qty 5

## 2015-05-05 NOTE — MAU Provider Note (Signed)
History    G2P0010 at 22 wks in with c/o productive cough for over a week. States greenish flem with cough. Also low abd pain she thinks brought on by so much coughing.  CSN: [id number]  Arrival date and time: 05/05/15 1946   None     No chief complaint on file.  HPI  OB History    Gravida Para Term Preterm AB TAB SAB Ectopic Multiple Living   2    1  1    0      Past Medical History  Diagnosis Date  . Pulmonary embolism on left   . Schizoaffective disorder   . Bipolar affective disorder 04/24/2014  . Anxiety   . Bipolar 1 disorder   . Depression   . Asthma   . Headache   . Chronic kidney disease     frequent UTI    No past surgical history on file.  Family History  Problem Relation Age of Onset  . Heart attack Father   . Heart disease Father   . Hypertension Father   . Anxiety disorder Mother   . Cancer Maternal Grandmother   . Stroke Paternal Grandmother     History  Substance Use Topics  . Smoking status: Current Every Day Smoker -- 0.25 packs/day    Types: Cigarettes  . Smokeless tobacco: Never Used  . Alcohol Use: No    Allergies:  Allergies  Allergen Reactions  . Peanuts [Peanut Oil] Anaphylaxis  . Yaz [Drospirenone-Ethinyl Estradiol]     Caused a pulmonary embolism.  Suppose to avoid estrogens    Prescriptions prior to admission  Medication Sig Dispense Refill Last Dose  . acetaminophen (TYLENOL) 500 MG tablet Take 500 mg by mouth every 6 (six) hours as needed for mild pain or headache.   Not Taking  . cephALEXin (KEFLEX) 500 MG capsule Take 1 capsule (500 mg total) by mouth 4 (four) times daily. 28 capsule 0   . enoxaparin (LOVENOX) 40 MG/0.4ML injection Inject 0.4 mLs (40 mg total) into the skin daily. 30 Syringe 10 Taking  . nitrofurantoin, macrocrystal-monohydrate, (MACROBID) 100 MG capsule Take 1 capsule (100 mg total) by mouth 2 (two) times daily. (Patient not taking: Reported on 04/01/2015) 14 capsule 0 Not Taking  . promethazine  (PHENERGAN) 25 MG tablet Take 0.5-1 tablets (12.5-25 mg total) by mouth every 6 (six) hours as needed. 30 tablet 2 Taking    Review of Systems  Constitutional: Negative.   HENT: Positive for sore throat.   Eyes: Negative.   Respiratory: Positive for cough and sputum production.   Cardiovascular: Negative.   Gastrointestinal: Positive for abdominal pain.  Genitourinary: Negative.   Musculoskeletal: Negative.   Skin: Negative.   Neurological: Negative.   Endo/Heme/Allergies: Negative.   Psychiatric/Behavioral: Negative.    Physical Exam   Last menstrual period 12/19/2014.  Physical Exam  Constitutional: She is oriented to person, place, and time. She appears well-developed and well-nourished.  HENT:  Head: Normocephalic.  Eyes: Pupils are equal, round, and reactive to light.  Neck: Normal range of motion.  Cardiovascular: Normal rate, regular rhythm, normal heart sounds and intact distal pulses.   Respiratory: Effort normal and breath sounds normal.  GI: Soft. Bowel sounds are normal.  Genitourinary: Vagina normal and uterus normal.  Musculoskeletal: Normal range of motion.  Neurological: She is alert and oriented to person, place, and time. She has normal reflexes.  Skin: Skin is warm and dry.  Psychiatric: She has a normal mood and affect. Her behavior  is normal. Judgment and thought content normal.    MAU Course  Procedures  MDM URTI at 22 wks  Assessment and Plan  LCTAB, persistant cough, SVE firm/cl/post high. Will start on po antibiotics and cough med  Wyvonnia DuskyLAWSON, MARIE DARLENE 05/05/2015, 8:04 PM

## 2015-05-05 NOTE — Discharge Instructions (Signed)
Upper Respiratory Infection, Adult An upper respiratory infection (URI) is also known as the common cold. It is often caused by a type of germ (virus). Colds are easily spread (contagious). You can pass it to others by kissing, coughing, sneezing, or drinking out of the same glass. Usually, you get better in 1 or 2 weeks.  HOME CARE   Only take medicine as told by your doctor.  Use a warm mist humidifier or breathe in steam from a hot shower.  Drink enough water and fluids to keep your pee (urine) clear or pale yellow.  Get plenty of rest.  Return to work when your temperature is back to normal or as told by your doctor. You may use a face mask and wash your hands to stop your cold from spreading. GET HELP RIGHT AWAY IF:   After the first few days, you feel you are getting worse.  You have questions about your medicine.  You have chills, shortness of breath, or brown or red spit (mucus).  You have yellow or brown snot (nasal discharge) or pain in the face, especially when you bend forward.  You have a fever, puffy (swollen) neck, pain when you swallow, or white spots in the back of your throat.  You have a bad headache, ear pain, sinus pain, or chest pain.  You have a high-pitched whistling sound when you breathe in and out (wheezing).  You have a lasting cough or cough up blood.  You have sore muscles or a stiff neck. MAKE SURE YOU:   Understand these instructions.  Will watch your condition.  Will get help right away if you are not doing well or get worse. Document Released: 05/31/2008 Document Revised: 03/06/2012 Document Reviewed: 03/20/2014 [facility] Patient Information 2015 [place], [place]. This information is not intended to replace advice given to you by your health care provider. Make sure you discuss any questions you have with your health care provider. Trichomoniasis Trichomoniasis is an infection caused by an organism called Trichomonas. The infection can affect  both women and men. In women, the outer female genitalia and the vagina are affected. In men, the penis is mainly affected, but the prostate and other reproductive organs can also be involved. Trichomoniasis is a sexually transmitted infection (STI) and is most often passed to another person through sexual contact.  RISK FACTORS  Having unprotected sexual intercourse.  Having sexual intercourse with an infected partner. SIGNS AND SYMPTOMS  Symptoms of trichomoniasis in women include:  Abnormal gray-green frothy vaginal discharge.  Itching and irritation of the vagina.  Itching and irritation of the area outside the vagina. Symptoms of trichomoniasis in men include:   Penile discharge with or without pain.  Pain during urination. This results from inflammation of the urethra. DIAGNOSIS  Trichomoniasis may be found during a Pap test or physical exam. Your health care provider may use one of the following methods to help diagnose this infection:  Examining vaginal discharge under a microscope. For men, urethral discharge would be examined.  Testing the pH of the vagina with a test tape.  Using a vaginal swab test that checks for the Trichomonas organism. A test is available that provides results within a few minutes.  Doing a culture test for the organism. This is not usually needed. TREATMENT   You may be given medicine to fight the infection. Women should inform their health care provider if they could be or are pregnant. Some medicines used to treat the infection should not be  taken during pregnancy.  Your health care provider may recommend over-the-counter medicines or creams to decrease itching or irritation.  Your sexual partner will need to be treated if infected. HOME CARE INSTRUCTIONS   Take medicines only as directed by your health care provider.  Take over-the-counter medicine for itching or irritation as directed by your health care provider.  Do not have sexual  intercourse while you have the infection.  Women should not douche or wear tampons while they have the infection.  Discuss your infection with your partner. Your partner may have gotten the infection from you, or you may have gotten it from your partner.  Have your sex partner get examined and treated if necessary.  Practice safe, informed, and protected sex.  See your health care provider for other STI testing. SEEK MEDICAL CARE IF:   You still have symptoms after you finish your medicine.  You develop abdominal pain.  You have pain when you urinate.  You have bleeding after sexual intercourse.  You develop a rash.  Your medicine makes you sick or makes you throw up (vomit). MAKE SURE YOU:  Understand these instructions.  Will watch your condition.  Will get help right away if you are not doing well or get worse. Document Released: 06/08/2001 Document Revised: 04/29/2014 Document Reviewed: 09/24/2013 [facility] Patient Information 2015 [place], [place]. This information is not intended to replace advice given to you by your health care provider. Make sure you discuss any questions you have with your health care provider.

## 2015-05-05 NOTE — MAU Note (Signed)
Pt @ 22wks with c/o cough for one week, sore throat,& lower, intermitent, sharp, abd pain. Denies fever, vag leaking, bleeding, pain with urination and reports good fetal movement.

## 2015-05-13 ENCOUNTER — Ambulatory Visit (INDEPENDENT_AMBULATORY_CARE_PROVIDER_SITE_OTHER): Admitting: Obstetrics & Gynecology

## 2015-05-13 ENCOUNTER — Encounter: Payer: Self-pay | Admitting: Obstetrics & Gynecology

## 2015-05-13 VITALS — BP 108/70 | HR 92 | Temp 98.6°F | Wt 152.4 lb

## 2015-05-13 DIAGNOSIS — Z3402 Encounter for supervision of normal first pregnancy, second trimester: Secondary | ICD-10-CM

## 2015-05-13 LAB — POCT URINALYSIS DIP (DEVICE)
Bilirubin Urine: NEGATIVE
GLUCOSE, UA: NEGATIVE mg/dL
Hgb urine dipstick: NEGATIVE
KETONES UR: NEGATIVE mg/dL
Nitrite: NEGATIVE
Protein, ur: NEGATIVE mg/dL
Specific Gravity, Urine: 1.02 (ref 1.005–1.030)
UROBILINOGEN UA: 0.2 mg/dL (ref 0.0–1.0)
pH: 7 (ref 5.0–8.0)

## 2015-05-13 MED ORDER — PANTOPRAZOLE SODIUM 40 MG PO TBEC: 40.0000 mg | DELAYED_RELEASE_TABLET | Freq: Every day | ORAL | Status: DC

## 2015-05-13 NOTE — Progress Notes (Signed)
Edema- ankles   Pain-"cramping" Pt went to MAU on 05/05/15 for URI Pt reports continuing to have nausea; pheneragan, reglan, zofran, not effective Pt needs an Rx for an inhaler for asthma

## 2015-05-13 NOTE — Patient Instructions (Signed)
Second Trimester of Pregnancy The second trimester is from week 13 through week 28, months 4 through 6. The second trimester is often a time when you feel your best. Your body has also adjusted to being pregnant, and you begin to feel better physically. Usually, morning sickness has lessened or quit completely, you may have more energy, and you may have an increase in appetite. The second trimester is also a time when the fetus is growing rapidly. At the end of the sixth month, the fetus is about 9 inches long and weighs about 1 pounds. You will likely begin to feel the baby move (quickening) between 18 and 20 weeks of the pregnancy. BODY CHANGES Your body goes through many changes during pregnancy. The changes vary from woman to woman.   Your weight will continue to increase. You will notice your lower abdomen bulging out.  You may begin to get stretch marks on your hips, abdomen, and breasts.  You may develop headaches that can be relieved by medicines approved by your health care provider.  You may urinate more often because the fetus is pressing on your bladder.  You may develop or continue to have heartburn as a result of your pregnancy.  You may develop constipation because certain hormones are causing the muscles that push waste through your intestines to slow down.  You may develop hemorrhoids or swollen, bulging veins (varicose veins).  You may have back pain because of the weight gain and pregnancy hormones relaxing your joints between the bones in your pelvis and as a result of a shift in weight and the muscles that support your balance.  Your breasts will continue to grow and be tender.  Your gums may bleed and may be sensitive to brushing and flossing.  Dark spots or blotches (chloasma, mask of pregnancy) may develop on your face. This will likely fade after the baby is born.  A dark line from your belly button to the pubic area (linea nigra) may appear. This will likely fade  after the baby is born.  You may have changes in your hair. These can include thickening of your hair, rapid growth, and changes in texture. Some women also have hair loss during or after pregnancy, or hair that feels dry or thin. Your hair will most likely return to normal after your baby is born. WHAT TO EXPECT AT YOUR PRENATAL VISITS During a routine prenatal visit:  You will be weighed to make sure you and the fetus are growing normally.  Your blood pressure will be taken.  Your abdomen will be measured to track your baby's growth.  The fetal heartbeat will be listened to.  Any test results from the previous visit will be discussed. Your health care provider may ask you:  How you are feeling.  If you are feeling the baby move.  If you have had any abnormal symptoms, such as leaking fluid, bleeding, severe headaches, or abdominal cramping.  If you have any questions. Other tests that may be performed during your second trimester include:  Blood tests that check for:  Low iron levels (anemia).  Gestational diabetes (between 24 and 28 weeks).  Rh antibodies.  Urine tests to check for infections, diabetes, or protein in the urine.  An ultrasound to confirm the proper growth and development of the baby.  An amniocentesis to check for possible genetic problems.  Fetal screens for spina bifida and Down syndrome. HOME CARE INSTRUCTIONS   Avoid all smoking, herbs, alcohol, and unprescribed   drugs. These chemicals affect the formation and growth of the baby.  Follow your health care provider's instructions regarding medicine use. There are medicines that are either safe or unsafe to take during pregnancy.  Exercise only as directed by your health care provider. Experiencing uterine cramps is a good sign to stop exercising.  Continue to eat regular, healthy meals.  Wear a good support bra for breast tenderness.  Do not use hot tubs, steam rooms, or saunas.  Wear your  seat belt at all times when driving.  Avoid raw meat, uncooked cheese, cat litter boxes, and soil used by cats. These carry germs that can cause birth defects in the baby.  Take your prenatal vitamins.  Try taking a stool softener (if your health care provider approves) if you develop constipation. Eat more high-fiber foods, such as fresh vegetables or fruit and whole grains. Drink plenty of fluids to keep your urine clear or pale yellow.  Take warm sitz baths to soothe any pain or discomfort caused by hemorrhoids. Use hemorrhoid cream if your health care provider approves.  If you develop varicose veins, wear support hose. Elevate your feet for 15 minutes, 3-4 times a day. Limit salt in your diet.  Avoid heavy lifting, wear low heel shoes, and practice good posture.  Rest with your legs elevated if you have leg cramps or low back pain.  Visit your dentist if you have not gone yet during your pregnancy. Use a soft toothbrush to brush your teeth and be gentle when you floss.  A sexual relationship may be continued unless your health care provider directs you otherwise.  Continue to go to all your prenatal visits as directed by your health care provider. SEEK MEDICAL CARE IF:   You have dizziness.  You have mild pelvic cramps, pelvic pressure, or nagging pain in the abdominal area.  You have persistent nausea, vomiting, or diarrhea.  You have a bad smelling vaginal discharge.  You have pain with urination. SEEK IMMEDIATE MEDICAL CARE IF:   You have a fever.  You are leaking fluid from your vagina.  You have spotting or bleeding from your vagina.  You have severe abdominal cramping or pain.  You have rapid weight gain or loss.  You have shortness of breath with chest pain.  You notice sudden or extreme swelling of your face, hands, ankles, feet, or legs.  You have not felt your baby move in over an hour.  You have severe headaches that do not go away with  medicine.  You have vision changes. Document Released: 12/07/2001 Document Revised: 12/18/2013 Document Reviewed: 02/13/2013 Exit[employer] Patient Information 2015 [employer]. This information is not intended to replace advice given to you by your health care provider. Make sure you discuss any questions you have with your health care provider.  

## 2015-05-13 NOTE — Progress Notes (Signed)
[place], will Rx Protonix 40 mg

## 2015-05-15 ENCOUNTER — Encounter: Payer: Self-pay | Admitting: Obstetrics & Gynecology

## 2015-05-16 ENCOUNTER — Encounter: Payer: Self-pay | Admitting: General Practice

## 2015-05-20 ENCOUNTER — Encounter: Payer: Self-pay | Admitting: General Practice

## 2015-05-22 ENCOUNTER — Inpatient Hospital Stay (HOSPITAL_COMMUNITY)

## 2015-05-22 ENCOUNTER — Encounter (HOSPITAL_COMMUNITY): Payer: Self-pay | Admitting: *Deleted

## 2015-05-22 ENCOUNTER — Inpatient Hospital Stay (HOSPITAL_COMMUNITY)
Admission: AD | Admit: 2015-05-22 | Discharge: 2015-05-22 | Disposition: A | Discharge status: Home / Self Care | Source: Ambulatory Visit | Attending: Obstetrics & Gynecology | Admitting: Obstetrics & Gynecology

## 2015-05-22 DIAGNOSIS — Z86711 Personal history of pulmonary embolism: Secondary | ICD-10-CM | POA: Diagnosis not present

## 2015-05-22 DIAGNOSIS — Z87440 Personal history of urinary (tract) infections: Secondary | ICD-10-CM | POA: Diagnosis not present

## 2015-05-22 DIAGNOSIS — O99332 Smoking (tobacco) complicating pregnancy, second trimester: Secondary | ICD-10-CM | POA: Diagnosis not present

## 2015-05-22 DIAGNOSIS — Z8249 Family history of ischemic heart disease and other diseases of the circulatory system: Secondary | ICD-10-CM | POA: Insufficient documentation

## 2015-05-22 DIAGNOSIS — Z3A24 24 weeks gestation of pregnancy: Secondary | ICD-10-CM | POA: Diagnosis not present

## 2015-05-22 DIAGNOSIS — R1032 Left lower quadrant pain: Secondary | ICD-10-CM | POA: Diagnosis not present

## 2015-05-22 DIAGNOSIS — Z7901 Long term (current) use of anticoagulants: Secondary | ICD-10-CM | POA: Diagnosis not present

## 2015-05-22 DIAGNOSIS — F17210 Nicotine dependence, cigarettes, uncomplicated: Secondary | ICD-10-CM | POA: Diagnosis not present

## 2015-05-22 DIAGNOSIS — O99512 Diseases of the respiratory system complicating pregnancy, second trimester: Secondary | ICD-10-CM | POA: Diagnosis not present

## 2015-05-22 DIAGNOSIS — O9989 Other specified diseases and conditions complicating pregnancy, childbirth and the puerperium: Secondary | ICD-10-CM

## 2015-05-22 DIAGNOSIS — R109 Unspecified abdominal pain: Secondary | ICD-10-CM | POA: Diagnosis present

## 2015-05-22 DIAGNOSIS — Z79899 Other long term (current) drug therapy: Secondary | ICD-10-CM | POA: Insufficient documentation

## 2015-05-22 DIAGNOSIS — J45909 Unspecified asthma, uncomplicated: Secondary | ICD-10-CM | POA: Diagnosis not present

## 2015-05-22 DIAGNOSIS — O26899 Other specified pregnancy related conditions, unspecified trimester: Secondary | ICD-10-CM

## 2015-05-22 LAB — CBC
HCT: 33.2 % — ABNORMAL LOW (ref 36.0–46.0)
HEMOGLOBIN: 11.6 g/dL — AB (ref 12.0–15.0)
MCH: 32.4 pg (ref 26.0–34.0)
MCHC: 34.9 g/dL (ref 30.0–36.0)
MCV: 92.7 fL (ref 78.0–100.0)
Platelets: 231 10*3/uL (ref 150–400)
RBC: 3.58 MIL/uL — ABNORMAL LOW (ref 3.87–5.11)
RDW: 13.5 % (ref 11.5–15.5)
WBC: 9.6 10*3/uL (ref 4.0–10.5)

## 2015-05-22 LAB — BASIC METABOLIC PANEL
Anion gap: 5 (ref 5–15)
BUN: 11 mg/dL (ref 6–20)
CO2: 18 mmol/L — ABNORMAL LOW (ref 22–32)
Calcium: 9.2 mg/dL (ref 8.9–10.3)
Chloride: 112 mmol/L — ABNORMAL HIGH (ref 101–111)
Creatinine, Ser: 0.6 mg/dL (ref 0.44–1.00)
GFR calc Af Amer: >60 mL/min (ref >60)
GFR calc non Af Amer: >60 mL/min (ref >60)
Glucose, Bld: 92 mg/dL (ref 65–99)
Potassium: 3.7 mmol/L (ref 3.5–5.1)
Sodium: 135 mmol/L (ref 135–145)

## 2015-05-22 LAB — WET PREP, GENITAL
Clue Cells Wet Prep HPF POC: NONE SEEN
Trich, Wet Prep: NONE SEEN
WBC, Wet Prep HPF POC: NONE SEEN
Yeast Wet Prep HPF POC: NONE SEEN

## 2015-05-22 LAB — URINALYSIS, ROUTINE W REFLEX MICROSCOPIC
Bilirubin Urine: NEGATIVE
Glucose, UA: NEGATIVE mg/dL
Hgb urine dipstick: NEGATIVE
Ketones, ur: NEGATIVE mg/dL
Leukocytes, UA: NEGATIVE
Nitrite: NEGATIVE
Protein, ur: NEGATIVE mg/dL
Specific Gravity, Urine: 1.01 (ref 1.005–1.030)
Urobilinogen, UA: 0.2 mg/dL (ref 0.0–1.0)
pH: 6.5 (ref 5.0–8.0)

## 2015-05-22 LAB — FETAL FIBRONECTIN: Fetal Fibronectin: NEGATIVE

## 2015-05-22 MED ORDER — FENTANYL CITRATE (PF) 100 MCG/2ML IJ SOLN
50.0000 ug | Freq: Once | INTRAMUSCULAR | Status: AC
  Administered 2015-05-22: 50 ug via INTRAVENOUS
  Filled 2015-05-22: qty 2

## 2015-05-22 MED ORDER — ALBUTEROL SULFATE HFA 108 (90 BASE) MCG/ACT IN AERS: 2.0000 | INHALATION_SPRAY | Freq: Four times a day (QID) | RESPIRATORY_TRACT | Status: AC | PRN

## 2015-05-22 MED ORDER — LACTATED RINGERS IV BOLUS (SEPSIS)
1000.0000 mL | Freq: Once | INTRAVENOUS | Status: AC
  Administered 2015-05-22: 1000 mL via INTRAVENOUS

## 2015-05-22 MED ORDER — ONDANSETRON HCL 4 MG/2ML IJ SOLN
4.0000 mg | Freq: Once | INTRAMUSCULAR | Status: AC
  Administered 2015-05-22: 4 mg via INTRAVENOUS
  Filled 2015-05-22: qty 2

## 2015-05-22 NOTE — MAU Provider Note (Signed)
History   CSN: [id number]  Arrival date and time: 05/22/15 1525   First Provider Initiated Contact with Patient 05/22/15 1541     HPI  Lavonna RuaHaley Busch is a 19 y.o.  G2P0010 [id number] here with complaints of sudden-onset sharp abdominal pain. She states that pain started about 40 minutes ago and is sharp. Says she was having a verbal altercation with her boyfriend when she started having abdominal cramping; "like menstrual cramps". She then pent over to try and elevate the pain and the pain got worse. States it si a 10/10 and constant. States since time of pain beginning she has not felt FM. Denies LOF, VB, contractions, and vaginal discharge. Patient denies feeling any pelvic pressure. Denies trauma to abdomen. Location of pain located LLQ.   Patient also states that she had a mild temperature today to 100.23F and took a tylenol. She has had n/v throughout entire pregnancy and is endorsing nausea currently.   Of note patient on Lovenox for PE.  OB History    Gravida Para Term Preterm AB TAB SAB Ectopic Multiple Living   2    1  1    0      Past Medical History  Diagnosis Date  . Pulmonary embolism on left   . Schizoaffective disorder   . Bipolar affective disorder 04/24/2014  . Anxiety   . Bipolar 1 disorder   . Depression   . Asthma   . Headache   . Chronic kidney disease     frequent UTI    Past Surgical History  Procedure Laterality Date  . Tooth extraction      Family History  Problem Relation Age of Onset  . Heart attack Father   . Heart disease Father   . Hypertension Father   . Anxiety disorder Mother   . Cancer Maternal Grandmother   . Stroke Paternal Grandmother     History  Substance Use Topics  . Smoking status: Current Every Day Smoker -- 0.25 packs/day    Types: Cigarettes  . Smokeless tobacco: Never Used  . Alcohol Use: No    Allergies:  Allergies  Allergen Reactions  . Peanuts [Peanut Oil] Anaphylaxis  . Yaz [Drospirenone-Ethinyl Estradiol]    Caused a pulmonary embolism.  Suppose to avoid estrogens    Prescriptions prior to admission  Medication Sig Dispense Refill Last Dose  . acetaminophen (TYLENOL) 500 MG tablet Take 500 mg by mouth every 6 (six) hours as needed for mild pain or headache.   Taking  . albuterol (PROVENTIL HFA;VENTOLIN HFA) 108 (90 BASE) MCG/ACT inhaler Inhale 2 puffs into the lungs every 6 (six) hours as needed for wheezing or shortness of breath.   Taking  . albuterol (PROVENTIL) (2.5 MG/3ML) 0.083% nebulizer solution Take 2.5 mg by nebulization every 6 (six) hours as needed for wheezing or shortness of breath.   Taking  . amoxicillin-clavulanate (AUGMENTIN) 875-125 MG per tablet Take 1 tablet by mouth 2 (two) times daily. 20 tablet 0 Taking  . chlorpheniramine-HYDROcodone (TUSSIONEX) 10-8 MG/5ML SUER Take 5 mLs by mouth 2 (two) times daily. (Patient not taking: Reported on 05/13/2015) 140 mL 0 Not Taking  . diphenhydrAMINE (BENADRYL) 25 MG tablet Take 25 mg by mouth every 6 (six) hours as needed for allergies.   Not Taking  . enoxaparin (LOVENOX) 40 MG/0.4ML injection Inject 0.4 mLs (40 mg total) into the skin daily. 30 Syringe 10 Taking  . metroNIDAZOLE (FLAGYL) 500 MG tablet Take 1 tablet (500 mg total) by mouth 2 (two)  times daily. 20 tablet 0 Taking  . pantoprazole (PROTONIX) 40 MG tablet Take 1 tablet (40 mg total) by mouth daily. 30 tablet 5   . Prenatal Vit-Fe Fumarate-FA (PRENATAL MULTIVITAMIN) TABS tablet Take 1 tablet by mouth daily at 12 noon.   Taking  . promethazine (PHENERGAN) 25 MG tablet Take 0.5-1 tablets (12.5-25 mg total) by mouth every 6 (six) hours as needed. (Patient not taking: Reported on 05/05/2015) 30 tablet 2 Not Taking   Review of Systems  Constitutional: Positive for fever. Negative for chills.  Eyes: Negative for blurred vision and double vision.  Respiratory: Negative for shortness of breath.   Cardiovascular: Negative for chest pain and leg swelling.  Gastrointestinal: Positive for  nausea, vomiting and abdominal pain. Negative for constipation.  Genitourinary: Negative for dysuria.  Neurological: Negative for dizziness and headaches.     Physical Exam   Last menstrual period 12/19/2014.  BP 106/58 mmHg  Pulse 67  Resp 18  LMP 12/19/2014 (Approximate)    Physical Exam  Constitutional: She is oriented to person, place, and time. She appears well-developed and well-nourished. She appears distressed.  Respiratory: Effort normal and breath sounds normal.  GI: There is tenderness in the left lower quadrant.  Genitourinary: Vagina normal.  Musculoskeletal: Normal range of motion. She exhibits no tenderness.  Neurological: She is alert and oriented to person, place, and time.   SVE: 0/thick/closed  Results for orders placed or performed during the hospital encounter of 05/22/15 (from the past 24 hour(s))  Fetal fibronectin     Status: None   Collection Time: 05/22/15  3:42 PM  Result Value Ref Range   Fetal Fibronectin NEGATIVE NEGATIVE  CBC     Status: Abnormal   Collection Time: 05/22/15  4:05 PM  Result Value Ref Range   WBC 9.6 4.0 - 10.5 K/uL   RBC 3.58 (L) 3.87 - 5.11 MIL/uL   Hemoglobin 11.6 (L) 12.0 - 15.0 g/dL   HCT 40.9 (L) 81.1 - 91.4 %   MCV 92.7 78.0 - 100.0 fL   MCH 32.4 26.0 - 34.0 pg   MCHC 34.9 30.0 - 36.0 g/dL   RDW 78.2 95.6 - 21.3 %   Platelets 231 150 - 400 K/uL  Basic metabolic panel     Status: Abnormal   Collection Time: 05/22/15  4:05 PM  Result Value Ref Range   Sodium 135 135 - 145 mmol/L   Potassium 3.7 3.5 - 5.1 mmol/L   Chloride 112 (H) 101 - 111 mmol/L   CO2 18 (L) 22 - 32 mmol/L   Glucose, Bld 92 65 - 99 mg/dL   BUN 11 6 - 20 mg/dL   Creatinine, Ser 0.86 0.44 - 1.00 mg/dL   Calcium 9.2 8.9 - 57.8 mg/dL   GFR calc non Af Amer >60 >60 mL/min   GFR calc Af Amer >60 >60 mL/min   Anion gap 5 5 - 15  Wet prep, genital     Status: None   Collection Time: 05/22/15  5:00 PM  Result Value Ref Range   Yeast Wet Prep HPF  POC NONE SEEN NONE SEEN   Trich, Wet Prep NONE SEEN NONE SEEN   Clue Cells Wet Prep HPF POC NONE SEEN NONE SEEN   WBC, Wet Prep HPF POC NONE SEEN NONE SEEN  Urinalysis, Routine w reflex microscopic (not at [facility])     Status: None   Collection Time: 05/22/15  5:45 PM  Result Value Ref Range   Color, Urine YELLOW  YELLOW   APPearance CLEAR CLEAR   Specific Gravity, Urine 1.010 1.005 - 1.030   pH 6.5 5.0 - 8.0   Glucose, UA NEGATIVE NEGATIVE mg/dL   Hgb urine dipstick NEGATIVE NEGATIVE   Bilirubin Urine NEGATIVE NEGATIVE   Ketones, ur NEGATIVE NEGATIVE mg/dL   Protein, ur NEGATIVE NEGATIVE mg/dL   Urobilinogen, UA 0.2 0.0 - 1.0 mg/dL   Nitrite NEGATIVE NEGATIVE   Leukocytes, UA NEGATIVE NEGATIVE    MAU Course  Procedures - [place]   MDM: -SVE with closed long, posterior cervix -transvaginal and limited OB US - normal  -NST reactive and reassuring -FFN negative -Labs unremarkable including wet prep, UA, CBC, BMP -VSS -IV fluids, Fentanyl , and Zofran  IV - with resolution of pain  Assessment and Plan  Patient is 19 y.o. G2P0010 [redacted]w[redacted]d reporting sudden onset abdominal pain. Unsure of cause of abdominal pain at this time. Possible etiology could be GI pain vs MSK. Patient discharges in stable condition with resolution of pain.  - preterm labor precautions discussed - handout given  Caryl Ada, DO 05/22/2015, 3:54 PM PGY-1, [facility]  I performed the exam and agree with above.  [place], [place] 05/22/2015 9:17 PM

## 2015-05-22 NOTE — Discharge Instructions (Signed)
Abdominal Pain During Pregnancy °Belly (abdominal) pain is common during pregnancy. Most of the time, it is not a serious problem. Other times, it can be a sign that something is wrong with the pregnancy. Always tell your doctor if you have belly pain. °HOME CARE °Monitor your belly pain for any changes. The following actions may help you feel better: °· Do not have sex (intercourse) or put anything in your vagina until you feel better. °· Rest until your pain stops. °· Drink clear fluids if you feel sick to your stomach (nauseous). Do not eat solid food until you feel better. °· Only take medicine as told by your doctor. °· Keep all doctor visits as told. °GET HELP RIGHT AWAY IF:  °· You are bleeding, leaking fluid, or pieces of tissue come out of your vagina. °· You have more pain or cramping. °· You keep throwing up (vomiting). °· You have pain when you pee (urinate) or have blood in your pee. °· You have a fever. °· You do not feel your baby moving as much. °· You feel very weak or feel like passing out. °· You have trouble breathing, with or without belly pain. °· You have a very bad headache and belly pain. °· You have fluid leaking from your vagina and belly pain. °· You keep having watery poop (diarrhea). °· Your belly pain does not go away after resting, or the pain gets worse. °MAKE SURE YOU:  °· Understand these instructions. °· Will watch your condition. °· Will get help right away if you are not doing well or get worse. °Document Released: 12/01/2009 Document Revised: 08/15/2013 Document Reviewed: 07/12/2013 °ExitCare® Patient Information ©2015 [employer]. This information is not intended to replace advice given to you by your health care provider. Make sure you discuss any questions you have with your health care provider. °Braxton Hicks Contractions °Contractions of the uterus can occur throughout pregnancy. Contractions are not always a sign that you are in labor.  °WHAT ARE BRAXTON HICKS  CONTRACTIONS?  °Contractions that occur before labor are called Braxton Hicks contractions, or false labor. Toward the end of pregnancy (32-34 weeks), these contractions can develop more often and may become more forceful. This is not true labor because these contractions do not result in opening (dilatation) and thinning of the cervix. They are sometimes difficult to tell apart from true labor because these contractions can be forceful and people have different pain tolerances. You should not feel embarrassed if you go to the hospital with false labor. Sometimes, the only way to tell if you are in true labor is for your health care provider to look for changes in the cervix. °If there are no prenatal problems or other health problems associated with the pregnancy, it is completely safe to be sent home with false labor and await the onset of true labor. °HOW CAN YOU TELL THE DIFFERENCE BETWEEN TRUE AND FALSE LABOR? °False Labor °· The contractions of false labor are usually shorter and not as hard as those of true labor.   °· The contractions are usually irregular.   °· The contractions are often felt in the front of the lower abdomen and in the groin.   °· The contractions may go away when you walk around or change positions while lying down.   °· The contractions get weaker and are shorter lasting as time goes on.   °· The contractions do not usually become progressively stronger, regular, and closer together as with true labor.   °True Labor °·   Contractions in true labor last 30-70 seconds, become very regular, usually become more intense, and increase in frequency.   °· The contractions do not go away with walking.   °· The discomfort is usually felt in the top of the uterus and spreads to the lower abdomen and low back.   °· True labor can be determined by your health care provider with an exam. This will show that the cervix is dilating and getting thinner.   °WHAT TO REMEMBER °· Keep up with your usual  exercises and follow other instructions given by your health care provider.   °· Take medicines as directed by your health care provider.   °· Keep your regular prenatal appointments.   °· Eat and drink lightly if you think you are going into labor.   °· If Braxton Hicks contractions are making you uncomfortable:   °¨ Change your position from lying down or resting to walking, or from walking to resting.   °¨ Sit and rest in a tub of warm water.   °¨ Drink 2-3 glasses of water. Dehydration may cause these contractions.   °¨ Do slow and deep breathing several times an hour.   °WHEN SHOULD I SEEK IMMEDIATE MEDICAL CARE? °Seek immediate medical care if: °· Your contractions become stronger, more regular, and closer together.   °· You have fluid leaking or gushing from your vagina.   °· You have a fever.   °· You pass blood-tinged mucus.   °· You have vaginal bleeding.   °· You have continuous abdominal pain.   °· You have low back pain that you never had before.   °· You feel your baby's head pushing down and causing pelvic pressure.   °· Your baby is not moving as much as it used to.   °Document Released: 12/13/2005 Document Revised: 12/18/2013 Document Reviewed: 09/24/2013 °ExitCare® Patient Information ©2015 [employer]. This information is not intended to replace advice given to you by your health care provider. Make sure you discuss any questions you have with your health care provider. ° °
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
## 2015-05-27 ENCOUNTER — Encounter: Payer: Self-pay | Admitting: *Deleted

## 2015-06-10 ENCOUNTER — Encounter: Payer: Self-pay | Admitting: Obstetrics & Gynecology

## 2015-10-27 ENCOUNTER — Encounter (HOSPITAL_COMMUNITY): Payer: Self-pay | Admitting: Emergency Medicine

## 2015-10-27 ENCOUNTER — Emergency Department (HOSPITAL_COMMUNITY)
Admission: EM | Admit: 2015-10-27 | Discharge: 2015-10-28 | Disposition: A | Discharge status: Home / Self Care | Attending: Emergency Medicine | Admitting: Emergency Medicine

## 2015-10-27 ENCOUNTER — Emergency Department (HOSPITAL_COMMUNITY)

## 2015-10-27 DIAGNOSIS — R079 Chest pain, unspecified: Secondary | ICD-10-CM | POA: Diagnosis not present

## 2015-10-27 DIAGNOSIS — K920 Hematemesis: Secondary | ICD-10-CM | POA: Diagnosis present

## 2015-10-27 DIAGNOSIS — Z7901 Long term (current) use of anticoagulants: Secondary | ICD-10-CM | POA: Insufficient documentation

## 2015-10-27 DIAGNOSIS — R42 Dizziness and giddiness: Secondary | ICD-10-CM | POA: Insufficient documentation

## 2015-10-27 DIAGNOSIS — R112 Nausea with vomiting, unspecified: Secondary | ICD-10-CM | POA: Insufficient documentation

## 2015-10-27 DIAGNOSIS — N189 Chronic kidney disease, unspecified: Secondary | ICD-10-CM | POA: Diagnosis not present

## 2015-10-27 DIAGNOSIS — Z87440 Personal history of urinary (tract) infections: Secondary | ICD-10-CM | POA: Insufficient documentation

## 2015-10-27 DIAGNOSIS — Z3202 Encounter for pregnancy test, result negative: Secondary | ICD-10-CM | POA: Insufficient documentation

## 2015-10-27 DIAGNOSIS — Z8659 Personal history of other mental and behavioral disorders: Secondary | ICD-10-CM | POA: Diagnosis not present

## 2015-10-27 DIAGNOSIS — J45909 Unspecified asthma, uncomplicated: Secondary | ICD-10-CM | POA: Insufficient documentation

## 2015-10-27 DIAGNOSIS — Z720 Tobacco use: Secondary | ICD-10-CM | POA: Diagnosis not present

## 2015-10-27 DIAGNOSIS — Z79899 Other long term (current) drug therapy: Secondary | ICD-10-CM | POA: Diagnosis not present

## 2015-10-27 DIAGNOSIS — Z86711 Personal history of pulmonary embolism: Secondary | ICD-10-CM | POA: Diagnosis not present

## 2015-10-27 LAB — CBC
HEMATOCRIT: 43.3 % (ref 36.0–46.0)
HEMOGLOBIN: 14.5 g/dL (ref 12.0–15.0)
MCH: 29 pg (ref 26.0–34.0)
MCHC: 33.5 g/dL (ref 30.0–36.0)
MCV: 86.6 fL (ref 78.0–100.0)
Platelets: 297 10*3/uL (ref 150–400)
RBC: 5 MIL/uL (ref 3.87–5.11)
RDW: 13.6 % (ref 11.5–15.5)
WBC: 6.9 10*3/uL (ref 4.0–10.5)

## 2015-10-27 LAB — URINALYSIS, ROUTINE W REFLEX MICROSCOPIC
Glucose, UA: NEGATIVE mg/dL
KETONES UR: 15 mg/dL — AB
Nitrite: NEGATIVE
PROTEIN: NEGATIVE mg/dL
Specific Gravity, Urine: 1.025 (ref 1.005–1.030)
UROBILINOGEN UA: 0.2 mg/dL (ref 0.0–1.0)
pH: 6 (ref 5.0–8.0)

## 2015-10-27 LAB — HEPATIC FUNCTION PANEL
ALT: 78 U/L — ABNORMAL HIGH (ref 14–54)
AST: 66 U/L — ABNORMAL HIGH (ref 15–41)
Albumin: 4.4 g/dL (ref 3.5–5.0)
Alkaline Phosphatase: 58 U/L (ref 38–126)
BILIRUBIN TOTAL: 0.6 mg/dL (ref 0.3–1.2)
Bilirubin, Direct: 0.1 mg/dL (ref 0.1–0.5)
Indirect Bilirubin: 0.5 mg/dL (ref 0.3–0.9)
Total Protein: 8 g/dL (ref 6.5–8.1)

## 2015-10-27 LAB — BASIC METABOLIC PANEL
ANION GAP: 14 (ref 5–15)
BUN: 15 mg/dL (ref 6–20)
CHLORIDE: 98 mmol/L — AB (ref 101–111)
CO2: 25 mmol/L (ref 22–32)
Calcium: 9.5 mg/dL (ref 8.9–10.3)
Creatinine, Ser: 1.03 mg/dL — ABNORMAL HIGH (ref 0.44–1.00)
GFR calc Af Amer: >60 mL/min (ref >60)
GFR calc non Af Amer: >60 mL/min (ref >60)
GLUCOSE: 91 mg/dL (ref 65–99)
POTASSIUM: 3 mmol/L — AB (ref 3.5–5.1)
Sodium: 137 mmol/L (ref 135–145)

## 2015-10-27 LAB — URINE MICROSCOPIC-ADD ON

## 2015-10-27 LAB — I-STAT TROPONIN, ED: TROPONIN I, POC: 0.01 ng/mL (ref 0.00–0.08)

## 2015-10-27 LAB — LIPASE, BLOOD: LIPASE: 39 U/L (ref 11–51)

## 2015-10-27 LAB — PREGNANCY, URINE: PREG TEST UR: NEGATIVE

## 2015-10-27 MED ORDER — PANTOPRAZOLE SODIUM 40 MG IV SOLR
40.0000 mg | Freq: Once | INTRAVENOUS | Status: AC
  Administered 2015-10-27: 40 mg via INTRAVENOUS
  Filled 2015-10-27: qty 40

## 2015-10-27 MED ORDER — ONDANSETRON HCL 4 MG/2ML IJ SOLN
4.0000 mg | Freq: Once | INTRAMUSCULAR | Status: AC
  Administered 2015-10-27: 4 mg via INTRAVENOUS
  Filled 2015-10-27: qty 2

## 2015-10-27 MED ORDER — POTASSIUM CHLORIDE CRYS ER 20 MEQ PO TBCR
40.0000 meq | EXTENDED_RELEASE_TABLET | Freq: Once | ORAL | Status: AC
  Administered 2015-10-27: 40 meq via ORAL
  Filled 2015-10-27: qty 2

## 2015-10-27 MED ORDER — SODIUM CHLORIDE 0.9 % IV BOLUS (SEPSIS)
1000.0000 mL | Freq: Once | INTRAVENOUS | Status: AC
  Administered 2015-10-27: 1000 mL via INTRAVENOUS

## 2015-10-27 MED ORDER — ONDANSETRON 4 MG PO TBDP: ORAL_TABLET | ORAL | Status: DC

## 2015-10-27 NOTE — ED Provider Notes (Signed)
CSN: [id number]     Arrival date & time 10/27/15  1807 History   First MD Initiated Contact with Patient 10/27/15 2026     Chief Complaint  Patient presents with  . Hematemesis    started thursday  . Chest Pain    started 2 days ago     (Consider location/radiation/quality/duration/timing/severity/associated sxs/prior Treatment) The history is provided by the patient.  Shifa Brisbon is a 19 y.o. female hx of PE off lovenox, bipolar, here with vomiting. Patient states that she has been vomiting for the last week or so. Most of her vomiting is clear but she had 3 episodes blood-tinged vomit. Most recent blood-tinged vomit was today. Also has several episodes diarrhea as well. When of his family members sick with similar symptoms. She noticed being lightheaded and dizzy and sometimes felt like she gone passed out. She delivered a baby 3 months ago and is no longer having vaginal bleeding. Of note, patient had PE in 2014 and is off lovenox for 2 years    Past Medical History  Diagnosis Date  . 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 Schizoaffective disorder (HCC)   . Bipolar affective disorder (HCC) 04/24/2014  . Anxiety   . Bipolar 1 disorder (HCC)   . Depression   . Asthma   . Headache   . Chronic kidney disease     frequent UTI   Past Surgical History  Procedure Laterality Date  . Tooth extraction     Family History  Problem Relation Age of Onset  . Heart attack Father   . Heart disease Father   . Hypertension Father   . Anxiety disorder Mother   . Cancer Maternal Grandmother   . Stroke Paternal Grandmother    Social History  Substance Use Topics  . Smoking status: Current Every Day Smoker -- 0.50 packs/day    Types: Cigarettes  . Smokeless tobacco: Never Used  . Alcohol Use: No   OB History    Gravida Para Term Preterm AB TAB SAB Ectopic Multiple Living   0     Review of Systems  Neurological: Positive for dizziness.  All other systems reviewed and are  negative.     Allergies  Peanuts and Yaz  Home Medications   Prior to Admission medications   Medication Sig Start Date End Date Taking? Authorizing Provider  albuterol (PROVENTIL HFA;VENTOLIN HFA) 108 (90 BASE) MCG/ACT inhaler Inhale 2 puffs into the lungs every 6 (six) hours as needed for wheezing or shortness of breath. 05/22/15  Yes Pincus Large, DO  ondansetron (ZOFRAN) 4 MG tablet Take 4 mg by mouth every 8 (eight) hours as needed for nausea or vomiting.   Yes Historical Provider, MD  amoxicillin-clavulanate (AUGMENTIN) 875-125 MG per tablet Take 1 tablet by mouth 2 (two) times daily. Patient not taking: Reported on 05/22/2015 05/05/15   Montez Morita, CNM  chlorpheniramine-HYDROcodone (TUSSIONEX) 10-8 MG/5ML SUER Take 5 mLs by mouth 2 (two) times daily. Patient not taking: Reported on 05/13/2015 05/05/15   Montez Morita, CNM  enoxaparin (LOVENOX) 40 MG/0.4ML injection Inject 0.4 mLs (40 mg total) into the skin daily. 03/19/15   Lisa A Leftwich-Kirby, CNM  metroNIDAZOLE (FLAGYL) 500 MG tablet Take 1 tablet (500 mg total) by mouth 2 (two) times daily. Patient not taking: Reported on 05/22/2015 05/05/15   Montez Morita, CNM  promethazine (PHENERGAN) 25 MG tablet Take 0.5-1 tablets (12.5-25 mg total) by mouth  every 6 (six) hours as needed. Patient not taking: Reported on 05/05/2015 03/19/15   Wilmer FloorLisa A Leftwich-Kirby, CNM   BP 101/52 mmHg  Pulse 64  Temp(Src) 97.9 F (36.6 C) (Oral)  Resp 25  Ht 5\' 7"  (1.702 m)  Wt 143 lb (64.864 kg)  BMI 22.39 kg/m2  SpO2 100%  LMP 10/25/2015  Breastfeeding? Unknown Physical Exam  Constitutional: She is oriented to person, place, and time. She appears well-developed and well-nourished.  HENT:  Head: Normocephalic.  MM slightly dry   Eyes: Conjunctivae are normal. Pupils are equal, round, and reactive to light.  Neck: Normal range of motion. Neck supple.  Cardiovascular: Normal rate, regular rhythm and normal heart sounds.   Pulmonary/Chest: Effort  normal and breath sounds normal. No respiratory distress. She has no wheezes. She has no rales.  Abdominal: Soft. Bowel sounds are normal. She exhibits no distension. There is no tenderness. There is no rebound.  Musculoskeletal: Normal range of motion. She exhibits no edema or tenderness.  Neurological: She is alert and oriented to person, place, and time. No cranial nerve deficit. Coordination normal.  Skin: Skin is warm and dry.  Psychiatric: She has a normal mood and affect. Her behavior is normal. Judgment and thought content normal.  Nursing note and vitals reviewed.   ED Course  Procedures (including critical care time) Labs Review Labs Reviewed  BASIC METABOLIC PANEL - Abnormal; Notable for the following:    Potassium 3.0 (*)    Chloride 98 (*)    Creatinine, Ser 1.03 (*)    All other components within normal limits  HEPATIC FUNCTION PANEL - Abnormal; Notable for the following:    AST 66 (*)    ALT 78 (*)    All other components within normal limits  URINALYSIS, ROUTINE W REFLEX MICROSCOPIC (NOT AT [facility]) - Abnormal; Notable for the following:    APPearance CLOUDY (*)    Hgb urine dipstick LARGE (*)    Bilirubin Urine SMALL (*)    Ketones, ur 15 (*)    Leukocytes, UA TRACE (*)    All other components within normal limits  URINE MICROSCOPIC-ADD ON - Abnormal; Notable for the following:    Squamous Epithelial / LPF FEW (*)    Bacteria, UA FEW (*)    All other components within normal limits  CBC  LIPASE, BLOOD  PREGNANCY, URINE  I-STAT TROPOININ, ED    Imaging Review Dg Chest 2 View  10/27/2015  CLINICAL DATA:  Chest pain for the past 4 days. EXAM: CHEST  2 VIEW COMPARISON:  Previous examinations, including the chest CTA dated 04/11/2014. FINDINGS: Normal sized heart. Clear lungs. Minimal central peribronchial thickening, unchanged. Normal appearing bones. IMPRESSION: No acute abnormality.  Stable minimal chronic bronchitic changes. Electronically Signed   By: Beckie SaltsSteven   Reid M.D.   On: 10/27/2015 19:05   I have personally reviewed and evaluated these images and lab results as part of my medical decision-making.   EKG Interpretation   Date/Time:  Monday October 27 2015 18:27:17 EDT Ventricular Rate:  83 PR Interval:  148 QRS Duration: 84 QT Interval:  352 QTC Calculation: 413 R Axis:   89 Text Interpretation:  Normal sinus rhythm with sinus arrhythmia Normal ECG  No significant change since last tracing Confirmed by YAO  MD, DAVID  ([id number]) on 10/27/2015 8:18:51 PM      MDM   Final diagnoses:  None    Lavonna RuaHaley Sakamoto is a 19 y.o. female here with vomiting, diarrhea. Abdomen nontender.  Likely gastro. Has several episodes of bloody vomit but most of the time it was clear. Consider reflux, less likely mallory weiss. She doesn't drink alcohol.   11:16 PM Hg stable. CXR unremarkable. UA + ketones. Given 1 L NS and zofran and tolerated juice. Will dc home with zofran.    Richardean Canal, MD 10/27/15 [phone]

## 2015-10-27 NOTE — ED Notes (Signed)
Pt states has been throwing up blood since Thursday -- "stringy and bright red" x 3 this week. also having chest pain-- midsternal, pt states feet feel like they are asleep. Pt is unsteady on feet. C/o being lightheaded also.

## 2015-10-27 NOTE — Discharge Instructions (Signed)
Stay hydrated.   Take zofran for nausea.  See your doctor.   Return to ER if you have worse vomiting, blood in vomit, severe abdominal pain, dehydration.

## 2015-10-27 NOTE — ED Notes (Signed)
Pt is 2 months post partum-- premature vaginal delivery,

## 2015-10-28 NOTE — ED Notes (Signed)
Pt. Left with all belongings and refused wheelchair. Discharge instructions were reviewed and all questions were answered.  

## 2016-03-20 ENCOUNTER — Inpatient Hospital Stay (HOSPITAL_COMMUNITY)
Admission: AD | Admit: 2016-03-20 | Discharge: 2016-03-21 | Disposition: A | Discharge status: Home / Self Care | Payer: Medicaid Other | Source: Ambulatory Visit | Attending: Obstetrics & Gynecology | Admitting: Obstetrics & Gynecology

## 2016-03-20 DIAGNOSIS — N760 Acute vaginitis: Secondary | ICD-10-CM | POA: Insufficient documentation

## 2016-03-20 DIAGNOSIS — B9689 Other specified bacterial agents as the cause of diseases classified elsewhere: Secondary | ICD-10-CM

## 2016-03-20 DIAGNOSIS — N946 Dysmenorrhea, unspecified: Secondary | ICD-10-CM

## 2016-03-20 DIAGNOSIS — N189 Chronic kidney disease, unspecified: Secondary | ICD-10-CM | POA: Insufficient documentation

## 2016-03-20 DIAGNOSIS — Z3202 Encounter for pregnancy test, result negative: Secondary | ICD-10-CM | POA: Insufficient documentation

## 2016-03-20 DIAGNOSIS — N939 Abnormal uterine and vaginal bleeding, unspecified: Secondary | ICD-10-CM | POA: Insufficient documentation

## 2016-03-20 DIAGNOSIS — J45909 Unspecified asthma, uncomplicated: Secondary | ICD-10-CM | POA: Insufficient documentation

## 2016-03-20 DIAGNOSIS — Z86711 Personal history of pulmonary embolism: Secondary | ICD-10-CM | POA: Insufficient documentation

## 2016-03-20 DIAGNOSIS — F259 Schizoaffective disorder, unspecified: Secondary | ICD-10-CM | POA: Insufficient documentation

## 2016-03-20 DIAGNOSIS — F17210 Nicotine dependence, cigarettes, uncomplicated: Secondary | ICD-10-CM | POA: Insufficient documentation

## 2016-03-21 ENCOUNTER — Encounter (HOSPITAL_COMMUNITY): Payer: Self-pay | Admitting: *Deleted

## 2016-03-21 DIAGNOSIS — Z3202 Encounter for pregnancy test, result negative: Secondary | ICD-10-CM | POA: Diagnosis not present

## 2016-03-21 DIAGNOSIS — N946 Dysmenorrhea, unspecified: Secondary | ICD-10-CM | POA: Diagnosis not present

## 2016-03-21 DIAGNOSIS — J45909 Unspecified asthma, uncomplicated: Secondary | ICD-10-CM | POA: Diagnosis not present

## 2016-03-21 DIAGNOSIS — N939 Abnormal uterine and vaginal bleeding, unspecified: Secondary | ICD-10-CM

## 2016-03-21 DIAGNOSIS — F17210 Nicotine dependence, cigarettes, uncomplicated: Secondary | ICD-10-CM | POA: Diagnosis not present

## 2016-03-21 DIAGNOSIS — N189 Chronic kidney disease, unspecified: Secondary | ICD-10-CM | POA: Diagnosis not present

## 2016-03-21 DIAGNOSIS — Z86711 Personal history of pulmonary embolism: Secondary | ICD-10-CM | POA: Diagnosis not present

## 2016-03-21 DIAGNOSIS — A499 Bacterial infection, unspecified: Secondary | ICD-10-CM | POA: Diagnosis not present

## 2016-03-21 DIAGNOSIS — N760 Acute vaginitis: Secondary | ICD-10-CM | POA: Diagnosis not present

## 2016-03-21 DIAGNOSIS — F259 Schizoaffective disorder, unspecified: Secondary | ICD-10-CM | POA: Diagnosis not present

## 2016-03-21 LAB — WET PREP, GENITAL
Sperm: NONE SEEN
Trich, Wet Prep: NONE SEEN
Yeast Wet Prep HPF POC: NONE SEEN

## 2016-03-21 LAB — RAPID URINE DRUG SCREEN, HOSP PERFORMED
Amphetamines: NOT DETECTED
BENZODIAZEPINES: NOT DETECTED
Barbiturates: NOT DETECTED
COCAINE: NOT DETECTED
OPIATES: NOT DETECTED
Tetrahydrocannabinol: POSITIVE — AB

## 2016-03-21 LAB — URINALYSIS, ROUTINE W REFLEX MICROSCOPIC
BILIRUBIN URINE: NEGATIVE
GLUCOSE, UA: NEGATIVE mg/dL
Hgb urine dipstick: NEGATIVE
KETONES UR: NEGATIVE mg/dL
LEUKOCYTES UA: NEGATIVE
NITRITE: NEGATIVE
PH: 7 (ref 5.0–8.0)
Protein, ur: NEGATIVE mg/dL
SPECIFIC GRAVITY, URINE: 1.01 (ref 1.005–1.030)

## 2016-03-21 LAB — CBC
HEMATOCRIT: 37 % (ref 36.0–46.0)
Hemoglobin: 12.4 g/dL (ref 12.0–15.0)
MCH: 31.8 pg (ref 26.0–34.0)
MCHC: 33.5 g/dL (ref 30.0–36.0)
MCV: 94.9 fL (ref 78.0–100.0)
PLATELETS: 229 10*3/uL (ref 150–400)
RBC: 3.9 MIL/uL (ref 3.87–5.11)
RDW: 14.1 % (ref 11.5–15.5)
WBC: 7.7 10*3/uL (ref 4.0–10.5)

## 2016-03-21 LAB — POCT PREGNANCY, URINE: Preg Test, Ur: NEGATIVE

## 2016-03-21 MED ORDER — IBUPROFEN 600 MG PO TABS: 600.0000 mg | ORAL_TABLET | Freq: Four times a day (QID) | ORAL | Status: DC | PRN

## 2016-03-21 MED ORDER — MEDROXYPROGESTERONE ACETATE 10 MG PO TABS: 10.0000 mg | ORAL_TABLET | Freq: Every day | ORAL | Status: DC

## 2016-03-21 MED ORDER — METRONIDAZOLE 500 MG PO TABS: 500.0000 mg | ORAL_TABLET | Freq: Two times a day (BID) | ORAL | Status: DC

## 2016-03-21 NOTE — Discharge Instructions (Signed)
Abnormal Uterine Bleeding °Abnormal uterine bleeding can affect women at various stages in life, including teenagers, women in their reproductive years, pregnant women, and women who have reached menopause. Several kinds of uterine bleeding are considered abnormal, including: °· Bleeding or spotting between periods.   °· Bleeding after sexual intercourse.   °· Bleeding that is heavier or more than normal.   °· Periods that last longer than usual. °· Bleeding after menopause.   °Many cases of abnormal uterine bleeding are minor and simple to treat, while others are more serious. Any type of abnormal bleeding should be evaluated by your health care provider. Treatment will depend on the cause of the bleeding. °HOME CARE INSTRUCTIONS °Monitor your condition for any changes. The following actions may help to alleviate any discomfort you are experiencing: °· Avoid the use of tampons and douches as directed by your health care provider. °· Change your pads frequently. °You should get regular pelvic exams and Pap tests. Keep all follow-up appointments for diagnostic tests as directed by your health care provider.  °SEEK MEDICAL CARE IF:  °· Your bleeding lasts more than 1 week.   °· You feel dizzy at times.   °SEEK IMMEDIATE MEDICAL CARE IF:  °· You pass out.   °· You are changing pads every 15 to 30 minutes.   °· You have abdominal pain. °· You have a fever.   °· You become sweaty or weak.   °· You are passing large blood clots from the vagina.   °· You start to feel nauseous and vomit. °MAKE SURE YOU:  °· Understand these instructions. °· Will watch your condition. °· Will get help right away if you are not doing well or get worse. °  °This information is not intended to replace advice given to you by your health care provider. Make sure you discuss any questions you have with your health care provider. °  °Document Released: 12/13/2005 Document Revised: 12/18/2013 Document Reviewed: 07/12/2013 °Elsevier Interactive  Patient Education ©2016 [employer] ° °Dysmenorrhea °Menstrual cramps (dysmenorrhea) are caused by the muscles of the uterus tightening (contracting) during a menstrual period. For some women, this discomfort is merely bothersome. For others, dysmenorrhea can be severe enough to interfere with everyday activities for a few days each month. °Primary dysmenorrhea is menstrual cramps that last a couple of days when you start having menstrual periods or soon after. This often begins after a teenager starts having her period. As a woman gets older or has a baby, the cramps will usually lessen or disappear. Secondary dysmenorrhea begins later in life, lasts longer, and the pain may be stronger than primary dysmenorrhea. The pain may start before the period and last a few days after the period.  °CAUSES  °Dysmenorrhea is usually caused by an underlying problem, such as: °· The tissue lining the uterus grows outside of the uterus in other areas of the body (endometriosis). °· The endometrial tissue, which normally lines the uterus, is found in or grows into the muscular walls of the uterus (adenomyosis). °· The pelvic blood vessels are engorged with blood just before the menstrual period (pelvic congestive syndrome). °· Overgrowth of cells (polyps) in the lining of the uterus or cervix. °· Falling down of the uterus (prolapse) because of loose or stretched ligaments. °· Depression. °· Bladder problems, infection, or inflammation. °· Problems with the intestine, a tumor, or irritable bowel syndrome. °· Cancer of the female organs or bladder. °· A severely tipped uterus. °· A very tight opening or closed cervix. °· Noncancerous tumors of the uterus (  fibroids). °· Pelvic inflammatory disease (PID). °· Pelvic scarring (adhesions) from a previous surgery. °· Ovarian cyst. °· An intrauterine device (IUD) used for birth control. °RISK FACTORS °You may be at greater risk of dysmenorrhea if: °· You are younger than age 30. °· You  started puberty early. °· You have irregular or heavy bleeding. °· You have never given birth. °· You have a family history of this problem. °· You are a smoker. °SIGNS AND SYMPTOMS  °· Cramping or throbbing pain in your lower abdomen. °· Headaches. °· Lower back pain. °· Nausea or vomiting. °· Diarrhea. °· Sweating or dizziness. °· Loose stools. °DIAGNOSIS  °A diagnosis is based on your history, symptoms, physical exam, diagnostic tests, or procedures. Diagnostic tests or procedures may include: °· Blood tests. °· Ultrasonography. °· An examination of the lining of the uterus (dilation and curettage, D&C). °· An examination inside your abdomen or pelvis with a scope (laparoscopy). °· X-rays. °· CT scan. °· MRI. °· An examination inside the bladder with a scope (cystoscopy). °· An examination inside the intestine or stomach with a scope (colonoscopy, gastroscopy). °TREATMENT  °Treatment depends on the cause of the dysmenorrhea. Treatment may include: °· Pain medicine prescribed by your health care provider. °· Birth control pills or an IUD with progesterone hormone in it. °· Hormone replacement therapy. °· Nonsteroidal anti-inflammatory drugs (NSAIDs). These may help stop the production of prostaglandins. °· Surgery to remove adhesions, endometriosis, ovarian cyst, or fibroids. °· Removal of the uterus (hysterectomy). °· Progesterone shots to stop the menstrual period. °· Cutting the nerves on the sacrum that go to the female organs (presacral neurectomy). °· Electric current to the sacral nerves (sacral nerve stimulation). °· Antidepressant medicine. °· Psychiatric therapy, counseling, or group therapy. °· Exercise and physical therapy. °· Meditation and yoga therapy. °· Acupuncture. °HOME CARE INSTRUCTIONS  °· Only take over-the-counter or prescription medicines as directed by your health care provider. °· Place a heating pad or hot water bottle on your lower back or abdomen. Do not sleep with the heating  pad. °· Use aerobic exercises, walking, swimming, biking, and other exercises to help lessen the cramping. °· Massage to the lower back or abdomen may help. °· Stop smoking. °· Avoid alcohol and caffeine. °SEEK MEDICAL CARE IF:  °· Your pain does not get better with medicine. °· You have pain with sexual intercourse. °· Your pain increases and is not controlled with medicines. °· You have abnormal vaginal bleeding with your period. °· You develop nausea or vomiting with your period that is not controlled with medicine. °SEEK IMMEDIATE MEDICAL CARE IF:  °You pass out.  °  °This information is not intended to replace advice given to you by your health care provider. Make sure you discuss any questions you have with your health care provider. °  °Document Released: 12/13/2005 Document Revised: 08/15/2013 Document Reviewed: 05/31/2013 °Elsevier Interactive Patient Education ©2016 Elsevier Inc. ° °

## 2016-03-21 NOTE — MAU Note (Addendum)
Have had 3 periods in last 2 wks. LMP 02/23/16. No birth control due to PE in 2013 from [place]. Hands feel asleep and tired and weak. Bleeding thru tampons and small blood clot. Some lower abd pain

## 2016-03-21 NOTE — MAU Provider Note (Signed)
Chief Complaint: Vaginal Bleeding and Abdominal Pain   First Provider Initiated Contact with Patient 03/21/16 0128     SUBJECTIVE HPI: Helen Blackburn is a 20 y.o. G2P0111 female who presents to Maternity Admissions reporting heavy vaginal bleeding and cramping since yesterday. Has had 3 menstrual periods over the last month which is abnormal for her. Is not on any birth control because she has history of PE on birth control pills. Primary care provider is in the process of referring her to gynecologist for Nexplanon. Patient does not think she's pregnant.  Not currently on anticoagulant therapy.  Location: Suprapubic Quality: Cramping, sharp Severity: Reported 10/10 on pain scale 2 RN, but denies pain to CNM. Duration: 24 hours Context: With menstrual bleeding Timing: Intermittent Modifying factors: Partial relief with 200 mg dose of ibuprofen Associated signs and symptoms: Negative for fever, chills, vaginal discharge, dyspareunia, nausea, vomiting, diarrhea, constipation, dysuria, hematuria, frequency, urgency.  Past Medical History  Diagnosis Date  . 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 Schizoaffective disorder (HCC)   . Bipolar affective disorder (HCC) 04/24/2014  . Anxiety   . Bipolar 1 disorder (HCC)   . Depression   . Asthma   . Headache   . Chronic kidney disease     frequent UTI   OB History  Gravida Para Term Preterm AB SAB TAB Ectopic Multiple Living  # Outcome Date GA Lbr Len/2nd Weight Sex Delivery Anes PTL Lv  2 Preterm 08/15/15 [redacted]w[redacted]d   M Vag-Spont   Y  1 SAB              Past Surgical History  Procedure Laterality Date  . Tooth extraction     Social History   Social History  . Marital Status: Single    Spouse Name: N/A  . Number of Children: N/A  . Years of Education: N/A   Occupational History  . Not on file.   Social History Main Topics  . Smoking status: Current Every Day Smoker -- 0.50 packs/day    Types: Cigarettes  .  Smokeless tobacco: Never Used  . Alcohol Use: No  . Drug Use: Yes     Comment: May 05 2015  . Sexual Activity: No     Comment: last sex Mar 18 2015   Other Topics Concern  . Not on file   Social History Narrative   No current facility-administered medications on file prior to encounter.   Current Outpatient Prescriptions on File Prior to Encounter  Medication Sig Dispense Refill  . albuterol (PROVENTIL HFA;VENTOLIN HFA) 108 (90 BASE) MCG/ACT inhaler Inhale 2 puffs into the lungs every 6 (six) hours as needed for wheezing or shortness of breath. 1 Inhaler 0  . amoxicillin-clavulanate (AUGMENTIN) 875-125 MG per tablet Take 1 tablet by mouth 2 (two) times daily. (Patient not taking: Reported on 05/22/2015) 20 tablet 0  . chlorpheniramine-HYDROcodone (TUSSIONEX) 10-8 MG/5ML SUER Take 5 mLs by mouth 2 (two) times daily. (Patient not taking: Reported on 05/13/2015) 140 mL 0  . enoxaparin (LOVENOX) 40 MG/0.4ML injection Inject 0.4 mLs (40 mg total) into the skin daily. 30 Syringe 10  . metroNIDAZOLE (FLAGYL) 500 MG tablet Take 1 tablet (500 mg total) by mouth 2 (two) times daily. (Patient not taking: Reported on 05/22/2015) 20 tablet 0  . ondansetron (ZOFRAN ODT) 4 MG disintegrating tablet  ODT q6 hours prn nausea/vomit 10 tablet 0  . ondansetron (ZOFRAN) 4 MG tablet  Take 4 mg by mouth every 8 (eight) hours as needed for nausea or vomiting.    . promethazine (PHENERGAN) 25 MG tablet Take 0.5-1 tablets (12.5-25 mg total) by mouth every 6 (six) hours as needed. (Patient not taking: Reported on 05/05/2015) 30 tablet 2   Allergies  Allergen Reactions  . Peanuts [Peanut Oil] Anaphylaxis    All peanut products also  . Yaz [Drospirenone-Ethinyl Estradiol] Other (See Comments)    Caused a pulmonary embolism.  Suppose to avoid estrogens    I have reviewed the past Medical Hx, Surgical Hx, Social Hx, Allergies and Medications.   Review of Systems  Constitutional: Negative for fever and chills.   Gastrointestinal: Positive for abdominal pain. Negative for vomiting, diarrhea, constipation and rectal pain.  Genitourinary: Positive for vaginal bleeding and menstrual problem. Negative for dysuria, frequency, hematuria, flank pain, vaginal discharge, vaginal pain and pelvic pain.  Musculoskeletal: Negative for back pain.  Neurological: Negative for dizziness.    OBJECTIVE Patient Vitals for the past 24 hrs:  BP Temp Pulse Resp SpO2 Height Weight  03/21/16 0144 (!) 113/48 mmHg - 64 16 - - -  03/21/16 0012 116/59 mmHg 98.2 F (36.8 C) 63 18 99 % 5\' 7"  (1.702 m) 162 lb 6.4 oz (73.664 kg)   Constitutional: Well-developed, well-nourished female in no acute distress. No pallor. Cardiovascular: normal rate Respiratory: normal rate and effort.  GI: Abd soft, non-tender. No mass or rebound tenderness. MS: Extremities nontender, no edema, normal ROM Neurologic: Alert and oriented x 4.  Psych: sedated-appearing. Uncooperative. GU: Neg CVAT.  SPECULUM EXAM: NEFG, physiologic discharge, small amount of dark red blood noted, cervix clean  BIMANUAL: cervix closed; uterus normal size, no adnexal tenderness or masses. No CMT.  LAB RESULTS Results for orders placed or performed during the hospital encounter of 03/20/16 (from the past 24 hour(s))  Urinalysis, Routine w reflex microscopic (not at [facility])     Status: None   Collection Time: 03/21/16 12:20 AM  Result Value Ref Range   Color, Urine YELLOW YELLOW   APPearance CLEAR CLEAR   Specific Gravity, Urine 1.010 1.005 - 1.030   pH 7.0 5.0 - 8.0   Glucose, UA NEGATIVE NEGATIVE mg/dL   Hgb urine dipstick NEGATIVE NEGATIVE   Bilirubin Urine NEGATIVE NEGATIVE   Ketones, ur NEGATIVE NEGATIVE mg/dL   Protein, ur NEGATIVE NEGATIVE mg/dL   Nitrite NEGATIVE NEGATIVE   Leukocytes, UA NEGATIVE NEGATIVE  Pregnancy, urine POC     Status: None   Collection Time: 03/21/16 12:37 AM  Result Value Ref Range   Preg Test, Ur NEGATIVE NEGATIVE  CBC      Status: None   Collection Time: 03/21/16  1:05 AM  Result Value Ref Range   WBC 7.7 4.0 - 10.5 K/uL   RBC 3.90 3.87 - 5.11 MIL/uL   Hemoglobin 12.4 12.0 - 15.0 g/dL   HCT 16.137.0 09.636.0 - 04.546.0 %   MCV 94.9 78.0 - 100.0 fL   MCH 31.8 26.0 - 34.0 pg   MCHC 33.5 30.0 - 36.0 g/dL   RDW 40.914.1 81.111.5 - 91.415.5 %   Platelets 229 150 - 400 K/uL  Wet prep, genital     Status: Abnormal   Collection Time: 03/21/16  1:30 AM  Result Value Ref Range   Yeast Wet Prep HPF POC NONE SEEN NONE SEEN   Trich, Wet Prep NONE SEEN NONE SEEN   Clue Cells Wet Prep HPF POC PRESENT (A) NONE SEEN   WBC, Wet Prep HPF  POC MODERATE (A) NONE SEEN   Sperm NONE SEEN     IMAGING No results found.  MAU COURSE UA, UPT, CBC, wet prep, GC/chlamydia cultures.  Informed patient of normal hemoglobin and small amount of active bleeding. No indication for hormone therapy. Appropriate for expectant management. Patient extremely insistent that she be given medicine to stop her bleeding. We'll give 5 day course of Provera, informed patient that she will probably start bleeding a few days after medicine was discontinued. Strongly encouraged her to see OB/GYN to discuss menstrual problems and birth control choices. Recommend Mirena.  MDM - 20 year old non-pregnant female with abnormal uterine bleeding that is troubling to patient, but not heavy or prolonged enough to cause anemia or hemodynamic changes. - Patient's history of PE and concern about amount of menstrual bleeding Mirena IUD may be a better choice than Nexplanon.  ASSESSMENT 1. Abnormal uterine bleeding (AUB)   2. Dysmenorrhea   3. BV (bacterial vaginosis)     PLAN Discharge home in stable condition. Bleeding Precautions Rx ibuprofen and Provera.     Follow-up Information    Follow up with Gynecologist.   Why:  As soon as possible for birth control and management of abnormal uterine bleeding      Follow up with [facility] [place] [facility].   Why:  As needed in gynecologic emergencies   Contact information:   [address] [id number] mc [place] [address] [phone]       Medication List    STOP taking these medications        amoxicillin-clavulanate 875-125 MG tablet  Commonly known as:  AUGMENTIN     chlorpheniramine-HYDROcodone 10-8 MG/5ML Suer  Commonly known as:  TUSSIONEX     enoxaparin 40 MG/0.4ML injection  Commonly known as:  LOVENOX     ondansetron 4 MG disintegrating tablet  Commonly known as:  ZOFRAN ODT     ondansetron 4 MG tablet  Commonly known as:  ZOFRAN     promethazine 25 MG tablet  Commonly known as:  PHENERGAN      TAKE these medications        albuterol 108 (90 Base) MCG/ACT inhaler  Commonly known as:  PROVENTIL HFA;VENTOLIN HFA  Inhale 2 puffs into the lungs every 6 (six) hours as needed for wheezing or shortness of breath.     ibuprofen 600 MG tablet  Commonly known as:  ADVIL,MOTRIN  Take 1 tablet (600 mg total) by mouth every 6 (six) hours as needed for cramping.     medroxyPROGESTERone 10 MG tablet  Commonly known as:  PROVERA  Take 1 tablet (10 mg total) by mouth daily. Use for ten days     metroNIDAZOLE 500 MG tablet  Commonly known as:  FLAGYL  Take 1 tablet (500 mg total) by mouth 2 (two) times daily.       [place], CNM 03/21/2016  1:56 AM

## 2016-03-21 NOTE — Progress Notes (Signed)
Written and verbal d/c instructions given and understanding voiced. 

## 2016-03-22 LAB — GC/CHLAMYDIA PROBE AMP (~~LOC~~) NOT AT ARMC
Chlamydia: NEGATIVE
Neisseria Gonorrhea: NEGATIVE

## 2016-03-22 LAB — HIV ANTIBODY (ROUTINE TESTING W REFLEX): HIV SCREEN 4TH GENERATION: NONREACTIVE

## 2016-04-14 IMAGING — DX DG CHEST 2V
2 series · 2 of 2 positions shown · non-contrast
Comparison: Previous examinations, including the chest CTA dated
04/11/2014.

CLINICAL DATA: Chest pain for the past 4 days.

EXAM:
CHEST  2 VIEW

[w chest pa]
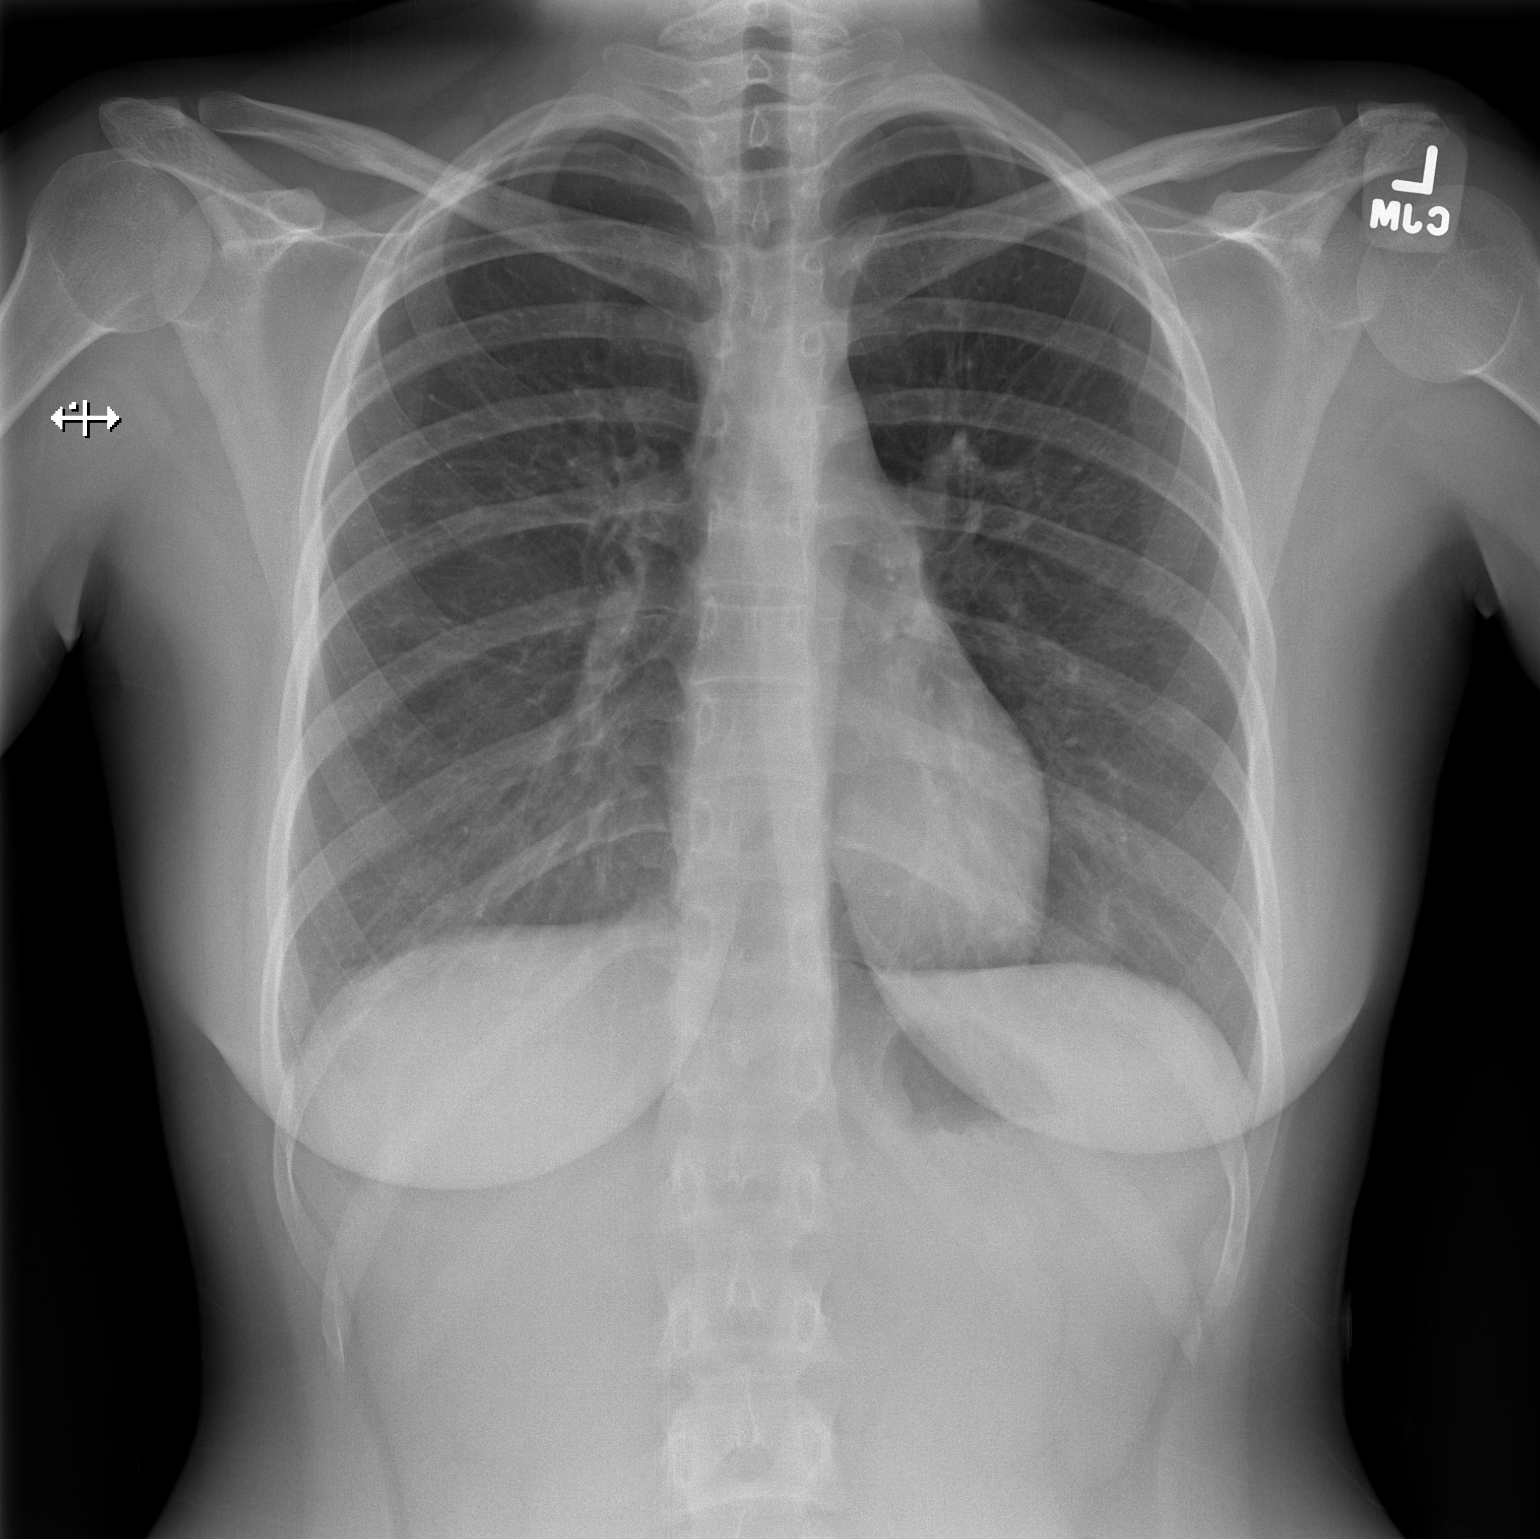

[w chest lat]
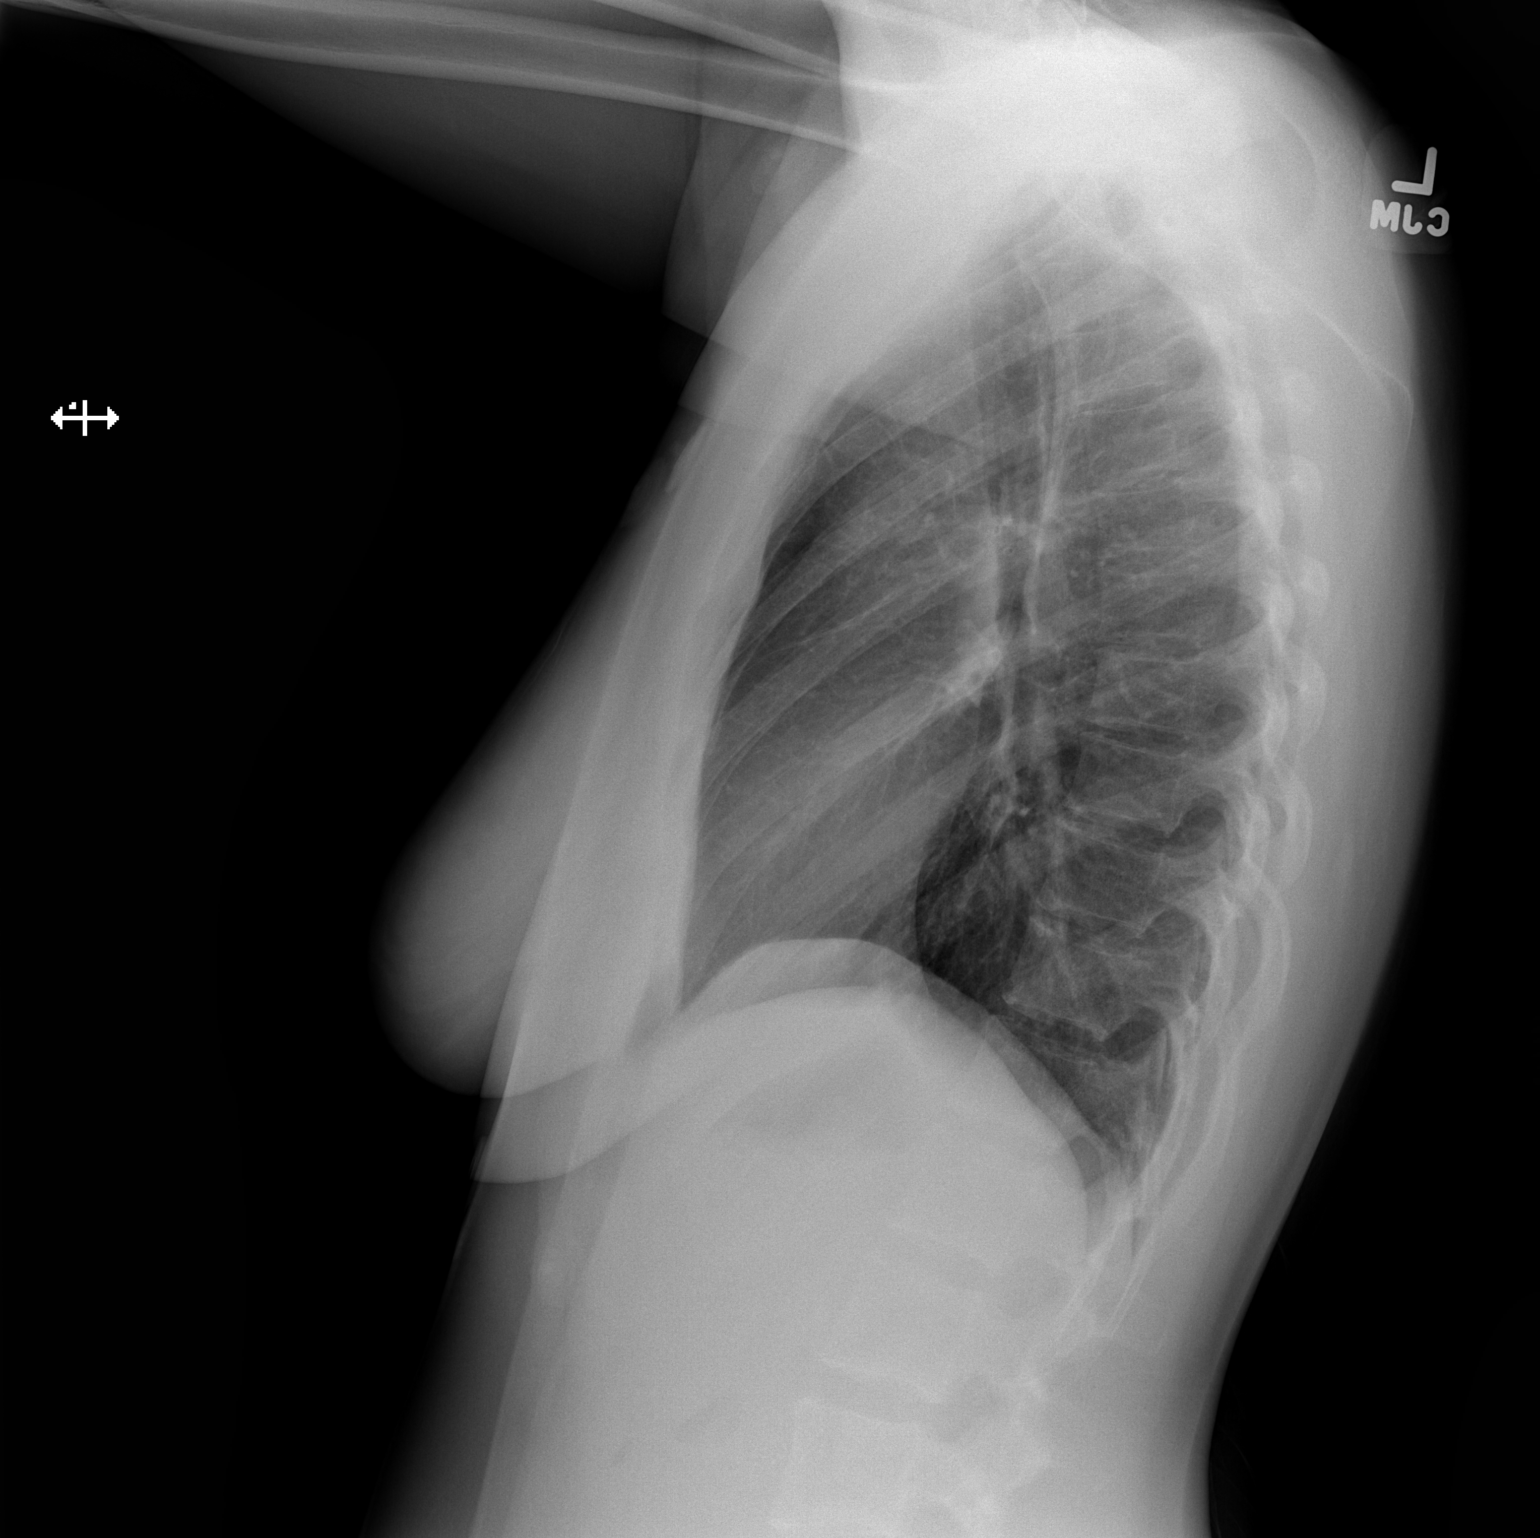

[2 of 2 positions shown; findings below may reference images not displayed]

FINDINGS: Normal sized heart. Clear lungs. Minimal central peribronchial
thickening, unchanged. Normal appearing bones.
IMPRESSION: No acute abnormality.  Stable minimal chronic bronchitic changes.

## 2016-11-26 ENCOUNTER — Inpatient Hospital Stay (HOSPITAL_COMMUNITY)
Admission: AD | Admit: 2016-11-26 | Discharge: 2016-11-26 | Disposition: A | Discharge status: Home / Self Care | Source: Ambulatory Visit | Attending: Obstetrics & Gynecology | Admitting: Obstetrics & Gynecology

## 2016-11-26 ENCOUNTER — Encounter (HOSPITAL_COMMUNITY): Payer: Self-pay | Admitting: *Deleted

## 2016-11-26 ENCOUNTER — Inpatient Hospital Stay (HOSPITAL_COMMUNITY)

## 2016-11-26 DIAGNOSIS — Z3490 Encounter for supervision of normal pregnancy, unspecified, unspecified trimester: Secondary | ICD-10-CM

## 2016-11-26 DIAGNOSIS — R109 Unspecified abdominal pain: Secondary | ICD-10-CM | POA: Diagnosis present

## 2016-11-26 DIAGNOSIS — O2341 Unspecified infection of urinary tract in pregnancy, first trimester: Secondary | ICD-10-CM | POA: Insufficient documentation

## 2016-11-26 DIAGNOSIS — O99331 Smoking (tobacco) complicating pregnancy, first trimester: Secondary | ICD-10-CM | POA: Diagnosis not present

## 2016-11-26 DIAGNOSIS — Z86711 Personal history of pulmonary embolism: Secondary | ICD-10-CM | POA: Diagnosis not present

## 2016-11-26 DIAGNOSIS — F17210 Nicotine dependence, cigarettes, uncomplicated: Secondary | ICD-10-CM | POA: Insufficient documentation

## 2016-11-26 DIAGNOSIS — Z3A08 8 weeks gestation of pregnancy: Secondary | ICD-10-CM | POA: Insufficient documentation

## 2016-11-26 DIAGNOSIS — O26899 Other specified pregnancy related conditions, unspecified trimester: Secondary | ICD-10-CM

## 2016-11-26 DIAGNOSIS — R102 Pelvic and perineal pain: Secondary | ICD-10-CM

## 2016-11-26 HISTORY — DX: Syncope and collapse: R55

## 2016-11-26 LAB — CBC
HEMATOCRIT: 38.7 % (ref 36.0–46.0)
Hemoglobin: 13.9 g/dL (ref 12.0–15.0)
MCH: 33.3 pg (ref 26.0–34.0)
MCHC: 35.9 g/dL (ref 30.0–36.0)
MCV: 92.8 fL (ref 78.0–100.0)
Platelets: 218 10*3/uL (ref 150–400)
RBC: 4.17 MIL/uL (ref 3.87–5.11)
RDW: 13.4 % (ref 11.5–15.5)
WBC: 8.8 10*3/uL (ref 4.0–10.5)

## 2016-11-26 LAB — URINALYSIS, ROUTINE W REFLEX MICROSCOPIC
BILIRUBIN URINE: NEGATIVE
Glucose, UA: NEGATIVE mg/dL
Hgb urine dipstick: NEGATIVE
KETONES UR: NEGATIVE mg/dL
LEUKOCYTES UA: NEGATIVE
NITRITE: POSITIVE — AB
PH: 6 (ref 5.0–8.0)
PROTEIN: NEGATIVE mg/dL
Specific Gravity, Urine: 1.025 (ref 1.005–1.030)

## 2016-11-26 LAB — ABO/RH: ABO/RH(D): A NEG

## 2016-11-26 LAB — URINE MICROSCOPIC-ADD ON: RBC / HPF: NONE SEEN RBC/hpf (ref 0–5)

## 2016-11-26 LAB — POCT PREGNANCY, URINE: Preg Test, Ur: POSITIVE — AB

## 2016-11-26 LAB — HCG, QUANTITATIVE, PREGNANCY: hCG, Beta Chain, Quant, S: 10835 m[IU]/mL — ABNORMAL HIGH (ref <5)

## 2016-11-26 MED ORDER — MORPHINE SULFATE (PF) 4 MG/ML IV SOLN
2.0000 mg | Freq: Once | INTRAVENOUS | Status: AC
  Administered 2016-11-26: 2 mg via INTRAMUSCULAR
  Filled 2016-11-26: qty 1

## 2016-11-26 MED ORDER — CEFPODOXIME PROXETIL 100 MG PO TABS: 100.0000 mg | ORAL_TABLET | Freq: Two times a day (BID) | ORAL | 0 refills | Status: AC

## 2016-11-26 MED ORDER — DIPHENHYDRAMINE HCL 25 MG PO CAPS
25.0000 mg | ORAL_CAPSULE | Freq: Once | ORAL | Status: AC
  Administered 2016-11-26: 25 mg via ORAL
  Filled 2016-11-26: qty 1

## 2016-11-26 MED ORDER — PRENATAL VITAMIN 27-0.8 MG PO TABS: 1.0000 | ORAL_TABLET | Freq: Every day | ORAL | 5 refills | Status: AC

## 2016-11-26 NOTE — MAU Provider Note (Signed)
Chief Complaint: Abdominal Pain; Nausea; and Emesis   SUBJECTIVE HPI: Helen RuaHaley Blackburn is a 20 y.o. [id number] at 3320w1d who presents to Maternity Admissions reporting abdominal pain.  Patient states at about 2 am she woke up with some abdominal discomfort in the lower abdomen, but able to go back to sleep, but then woke back up at 8am in severe pain. Unable to walk easily due to pain. Tried using the bathroom but unable to go. Denies fevers/chills. Denies bleeding. +vomiting for 1 week all day, +diarrhea for 1 week (described as runny). No hematemesis or hematochezia/melena. Took a home UPT x2, positive 2 days ago. Never had pain like this. Patient states the pain has subsided some and is in more comfort now, "bearable", but states it is a 6/10. No abnormal vaginal discharge. Did not take any medication this AM for pain due to sudden onset and came here immediately. She states she is having a difficult time urinating this AM, unable to void this AM.   Patient's last menstrual period was 09/30/2016.  No history of STIs, ectopics. Has h/o PE from [facility] [place]) in 2014.     Past Medical History:  Diagnosis Date  . Anxiety   . Asthma   . Bipolar 1 disorder (HCC)   . Bipolar affective disorder (HCC) 04/24/2014  . Chronic kidney disease    frequent UTI  . Depression   . Headache   . 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 Schizoaffective disorder (HCC)   . Syncope    OB History  Gravida Para Term Preterm AB Living  3 1   1 1 1   SAB TAB Ectopic Multiple Live Births  1       1    # Outcome Date GA Lbr Len/2nd Weight Sex Delivery Anes PTL Lv  3 Current           2 Preterm 08/15/15 7967w4d   M Vag-Spont   LIV  1 SAB              Past Surgical History:  Procedure Laterality Date  . TOOTH EXTRACTION     Social History   Social History  . Marital status: Single    Spouse name: N/A  . Number of children: N/A  . Years of education: N/A   Occupational History  . Not on file.   Social History Main  Topics  . Smoking status: Current Every Day Smoker    Packs/day: 0.50    Types: Cigarettes  . Smokeless tobacco: Current User  . Alcohol use No  . Drug use:      Comment: May 05 2015  . Sexual activity: No     Comment: last sex Mar 18 2015   Other Topics Concern  . Not on file   Social History Narrative  . No narrative on file   No current facility-administered medications on file prior to encounter.    Current Outpatient Prescriptions on File Prior to Encounter  Medication Sig Dispense Refill  . albuterol (PROVENTIL HFA;VENTOLIN HFA) 108 (90 BASE) MCG/ACT inhaler Inhale 2 puffs into the lungs every 6 (six) hours as needed for wheezing or shortness of breath. 1 Inhaler 0  . ibuprofen (ADVIL,MOTRIN) 600 MG tablet Take 1 tablet (600 mg total) by mouth every 6 (six) hours as needed for cramping. 30 tablet 1  . medroxyPROGESTERone (PROVERA) 10 MG tablet Take 1 tablet (10 mg total) by mouth daily. Use for ten days 5 tablet 0  . metroNIDAZOLE (FLAGYL)  500 MG tablet Take 1 tablet (500 mg total) by mouth 2 (two) times daily. 14 tablet 0   Allergies  Allergen Reactions  . Peanuts [Peanut Oil] Anaphylaxis    All peanut products also  . Yaz [Drospirenone-Ethinyl Estradiol] Other (See Comments)    Caused a pulmonary embolism.  Suppose to avoid estrogens    I have reviewed the past Medical Hx, Surgical Hx, Social Hx, Allergies and Medications.   REVIEW OF SYSTEMS  A comprehensive ROS was negative except per HPI.     OBJECTIVE Patient Vitals for the past 24 hrs:  BP Temp Temp src Pulse Resp  11/26/16 0751 117/72 97.3 F (36.3 C) Oral (!) 59 18    PHYSICAL EXAM Constitutional: Well-developed, well-nourished female in no acute distress.  Cardiovascular: normal rate, rhythm, no murmurs Respiratory: normal rate and effort, CTAB  GI: Abd soft, +TTP in suprapubic area and RLQ/Right pelvis, negative guarding, negative rebound tenderness, non-distended. Pos BS x 4 MS: Extremities  nontender, no edema, normal ROM Neurologic: Alert and oriented x 4.  GU: Neg CVAT.    LAB RESULTS Results for orders placed or performed during the hospital encounter of 11/26/16 (from the past 24 hour(s))  Urinalysis, Routine w reflex microscopic (not at [facility])     Status: Abnormal   Collection Time: 11/26/16  7:50 AM  Result Value Ref Range   Color, Urine YELLOW YELLOW   APPearance CLEAR CLEAR   Specific Gravity, Urine 1.025 1.005 - 1.030   pH 6.0 5.0 - 8.0   Glucose, UA NEGATIVE NEGATIVE mg/dL   Hgb urine dipstick NEGATIVE NEGATIVE   Bilirubin Urine NEGATIVE NEGATIVE   Ketones, ur NEGATIVE NEGATIVE mg/dL   Protein, ur NEGATIVE NEGATIVE mg/dL   Nitrite POSITIVE (A) NEGATIVE   Leukocytes, UA NEGATIVE NEGATIVE  Urine microscopic-add on     Status: Abnormal   Collection Time: 11/26/16  7:50 AM  Result Value Ref Range   Squamous Epithelial / LPF 0-5 (A) NONE SEEN   WBC, UA 0-5 0 - 5 WBC/hpf   RBC / HPF NONE SEEN 0 - 5 RBC/hpf   Bacteria, UA MANY (A) NONE SEEN  Pregnancy, urine POC     Status: Abnormal   Collection Time: 11/26/16  7:56 AM  Result Value Ref Range   Preg Test, Ur POSITIVE (A) NEGATIVE    IMAGING No results found.  MAU COURSE CBC/ABO/BHCG TVUS Morphine UA:  +Nitrites, UCx sent (reviewed previous UCx: E. Coli in 2016 x2, resistant to ampicillin/cipro/levo, intermediate resistance to Keflex, but sensitive to Ceftazidime, Cefepime, Ceftriaxone).  Shortly after given morphine injection, patient developed a macular papular erythematous rash on bilateral lower extremities, non-pruritic, blanches when pressed. Denies throat swelling or DIB. Benadryl given to patient.   MDM Plan of care reviewed with patient, including labs and tests ordered and medical treatment.   ASSESSMENT 1. Pelvic pain affecting pregnancy   2. Urinary tract infection in mother during first trimester of pregnancy   3. Pregnancy at early stage     PLAN Discharge home in stable  condition. Antibiotics given for UTI Confirmed IUP Follow up with OB for initial prenatal visit     Medication List    TAKE these medications   albuterol 108 (90 Base) MCG/ACT inhaler Commonly known as:  PROVENTIL HFA;VENTOLIN HFA Inhale 2 puffs into the lungs every 6 (six) hours as needed for wheezing or shortness of breath.   cefpodoxime 100 MG tablet Commonly known as:  VANTIN Take 1 tablet (100 mg total)  by mouth 2 (two) times daily.   Prenatal Vitamin 27-0.8 MG Tabs Take 1 tablet by mouth daily.        Jen Mow, DO OB Fellow 11/26/2016 8:22 AM

## 2016-11-26 NOTE — MAU Note (Signed)
Brought in by EMS;denies any vaginal bleeding or vaginal discharge;

## 2016-11-26 NOTE — MAU Note (Signed)
Positive UTP 2 days ago; c/o lower R abdominal pain that started this morning @ 0800;unable to void or have a BM this AM; had a PE with her last pregnancy and delivered at 36 weeks; pt states that she thinks that she is about [redacted] weeks pregnant;

## 2016-11-26 NOTE — Discharge Instructions (Signed)
Abdominal Pain During Pregnancy Belly (abdominal) pain is common during pregnancy. Most of the time, it is not a serious problem. Other times, it can be a sign that something is wrong with the pregnancy. Always tell your doctor if you have belly pain. Follow these instructions at home: Monitor your belly pain for any changes. The following actions may help you feel better:  Do not have sex (intercourse) or put anything in your vagina until you feel better.  Rest until your pain stops.  Drink clear fluids if you feel sick to your stomach (nauseous). Do not eat solid food until you feel better.  Only take medicine as told by your doctor.  Keep all doctor visits as told. Get help right away if:  You are bleeding, leaking fluid, or pieces of tissue come out of your vagina.  You have more pain or cramping.  You keep throwing up (vomiting).  You have pain when you pee (urinate) or have blood in your pee.  You have a fever.  You do not feel your baby moving as much.  You feel very weak or feel like passing out.  You have trouble breathing, with or without belly pain.  You have a very bad headache and belly pain.  You have fluid leaking from your vagina and belly pain.  You keep having watery poop (diarrhea).  Your belly pain does not go away after resting, or the pain gets worse. This information is not intended to replace advice given to you by your health care provider. Make sure you discuss any questions you have with your health care provider. Document Released: 12/01/2009 Document Revised: 07/21/2016 Document Reviewed: 07/12/2013 Elsevier Interactive Patient Education  2017 Elsevier Inc.   Pregnancy and Urinary Tract Infection WHAT IS A URINARY TRACT INFECTION? A urinary tract infection (UTI) is an infection of any part of the urinary tract. This includes the kidneys, the tubes that connect your kidneys to your bladder (ureters), the bladder, and the tube that carries  urine out of your body (urethra). These organs make, store, and get rid of urine in the body. A UTI can be a bladder infection (cystitis) or a kidney infection (pyelonephritis). This infection may be caused by fungi, viruses, and bacteria. Bacteria are the most common cause of UTIs. You are more likely to develop a UTI during pregnancy because:  The physical and hormonal changes your body goes through can make it easier for bacteria to get into your urinary tract.  Your growing baby puts pressure on your uterus and can affect urine flow. DOES A UTI PLACE MY BABY AT RISK? An untreated UTI during pregnancy could lead to a kidney infection, which can cause health problems that could affect your baby. Possible complications of an untreated UTI include:  Having your baby before 37 weeks of pregnancy (premature).  Having a baby with a low birth weight.  Developing high blood pressure during pregnancy (preeclampsia). WHAT ARE THE SYMPTOMS OF A UTI? Symptoms of a UTI include:  Fever.  Frequent urination or passing small amounts of urine frequently.  Needing to urinate urgently.  Pain or a burning sensation with urination.  Urine that smells bad or unusual.  Cloudy urine.  Pain in the lower abdomen or back.  Trouble urinating.  Blood in the urine.  Vomiting or being less hungry than normal.  Diarrhea or abdominal pain.  Vaginal discharge. WHAT ARE THE TREATMENT OPTIONS FOR A UTI DURING PREGNANCY? Treatment for this condition may include:  Antibiotic  medicines that are safe to take during pregnancy.  Other medicines to treat less common causes of UTI. HOW CAN I PREVENT A UTI? To prevent a UTI:  Go to the bathroom as soon as you feel the need.  Always wipe from front to back.  Wash your genital area with soap and warm water daily.  Empty your bladder before and after sex.  Wear cotton underwear.  Limit your intake of high sugar foods or drinks, such as regular soda,  juice, and sweets..  Drink 6-8 glasses of water daily.  Do not wear tight-fitting pants.  Do not douche or use deodorant sprays.  Do not drink alcohol, caffeine, or carbonated drinks. These can irritate the bladder. WHEN SHOULD I SEEK MEDICAL CARE? Seek medical care if:  Your symptoms do not improve or get worse.  You have a fever after two days of treatment.  You have a rash.  You have abnormal vaginal discharge.  You have back or side pain.  You have chills.  You have nausea and vomiting. WHEN SHOULD I SEEK IMMEDIATE MEDICAL CARE? Seek immediate medical care if you are pregnant and:  You feel contractions in your uterus.  You have lower belly pain.  You have a gush of fluid from your vagina.  You have blood in your urine.  You are vomiting and cannot keep down any medicines or water. This information is not intended to replace advice given to you by your health care provider. Make sure you discuss any questions you have with your health care provider. Document Released: 04/09/2011 Document Revised: 05/17/2016 Document Reviewed: 11/03/2015 Elsevier Interactive Patient Education  2017 Elsevier Inc.  SAFE MEDICATIONS IN PREGNANCY  Acne:  Benzoyl Peroxide  Salicylic Acid   Backache/Headache:  Tylenol: 2 regular strength every 4 hours OR        2 Extra strength every 6 hours   Colds/Coughs/Allergies:  Benadryl (alcohol free) 25 mg every 6 hours as needed  Breath right strips  Claritin  Cepacol throat lozenges  Chloraseptic throat spray  Cold-Eeze- up to three times per day  Cough drops, alcohol free  Flonase (by prescription only)  Guaifenesin  Mucinex  Robitussin DM (plain only, alcohol free)  Saline nasal spray/drops  Sudafed (pseudoephedrine) & Actifed * use only after [redacted] weeks gestation and if you do not have high blood pressure  Tylenol  Vicks Vaporub  Zinc lozenges  Zyrtec   Constipation:  Colace  Ducolax suppositories  Fleet enema   Glycerin suppositories  Metamucil  Milk of magnesia  Miralax  Senokot  Smooth move tea   Diarrhea:  Kaopectate  Imodium A-D   *NO pepto Bismol   Hemorrhoids:  Anusol  Anusol HC  Preparation H  Tucks   Indigestion:  Tums  Maalox  Mylanta  Zantac  Pepcid   Insomnia:  Benadryl (alcohol free) 25mg  every 6 hours as needed  Tylenol PM  Unisom, no Gelcaps   Leg Cramps:  Tums  MagGel   Nausea/Vomiting:  Bonine  Dramamine  Emetrol  Ginger extract  Sea bands  Meclizine  Nausea medication to take during pregnancy:  Unisom (doxylamine succinate 25 mg tablets) Take one tablet daily at bedtime. If symptoms are not adequately controlled, the dose can be increased to a maximum recommended dose of two tablets daily (1/2 tablet in the morning, 1/2 tablet mid-afternoon and one at bedtime).  Vitamin B6 100mg  tablets. Take one tablet twice a day (up to 200 mg per day).   Skin Rashes:  Aveeno products  Benadryl cream or 25mg  every 6 hours as needed  Calamine Lotion  1% cortisone cream   Yeast infection:  Gyne-lotrimin 7  Monistat 7    **If taking multiple medications, please check labels to avoid duplicating the same active ingredients  **take medication as directed on the label  ** Do not exceed 4000 mg of tylenol in 24 hours  **Do not take medications that contain aspirin or ibuprofen

## 2016-11-28 LAB — CULTURE, OB URINE: Culture: >=100000 — AB

## 2016-12-01 ENCOUNTER — Other Ambulatory Visit (HOSPITAL_COMMUNITY): Payer: Self-pay | Admitting: Family Medicine

## 2016-12-01 DIAGNOSIS — Z3490 Encounter for supervision of normal pregnancy, unspecified, unspecified trimester: Secondary | ICD-10-CM

## 2016-12-01 DIAGNOSIS — R102 Pelvic and perineal pain: Principal | ICD-10-CM

## 2016-12-01 DIAGNOSIS — O26899 Other specified pregnancy related conditions, unspecified trimester: Secondary | ICD-10-CM

## 2016-12-03 ENCOUNTER — Ambulatory Visit
Admission: RE | Admit: 2016-12-03 | Discharge: 2016-12-03 | Disposition: A | Discharge status: Home / Self Care | Source: Ambulatory Visit | Attending: Family Medicine | Admitting: Family Medicine

## 2016-12-03 DIAGNOSIS — R102 Pelvic and perineal pain: Secondary | ICD-10-CM | POA: Diagnosis not present

## 2016-12-03 DIAGNOSIS — Z3490 Encounter for supervision of normal pregnancy, unspecified, unspecified trimester: Secondary | ICD-10-CM

## 2016-12-03 DIAGNOSIS — O26899 Other specified pregnancy related conditions, unspecified trimester: Secondary | ICD-10-CM | POA: Insufficient documentation

## 2016-12-03 DIAGNOSIS — Z3A00 Weeks of gestation of pregnancy not specified: Secondary | ICD-10-CM | POA: Diagnosis not present

## 2016-12-12 ENCOUNTER — Inpatient Hospital Stay (HOSPITAL_COMMUNITY)
Admission: AD | Admit: 2016-12-12 | Discharge: 2016-12-12 | Disposition: A | Discharge status: Home / Self Care | Source: Ambulatory Visit | Attending: Obstetrics and Gynecology | Admitting: Obstetrics and Gynecology

## 2016-12-12 ENCOUNTER — Encounter (HOSPITAL_COMMUNITY): Payer: Self-pay | Admitting: *Deleted

## 2016-12-12 DIAGNOSIS — F17210 Nicotine dependence, cigarettes, uncomplicated: Secondary | ICD-10-CM | POA: Diagnosis not present

## 2016-12-12 DIAGNOSIS — N189 Chronic kidney disease, unspecified: Secondary | ICD-10-CM | POA: Diagnosis not present

## 2016-12-12 DIAGNOSIS — O26891 Other specified pregnancy related conditions, first trimester: Secondary | ICD-10-CM | POA: Insufficient documentation

## 2016-12-12 DIAGNOSIS — Z823 Family history of stroke: Secondary | ICD-10-CM | POA: Insufficient documentation

## 2016-12-12 DIAGNOSIS — Z809 Family history of malignant neoplasm, unspecified: Secondary | ICD-10-CM | POA: Diagnosis not present

## 2016-12-12 DIAGNOSIS — Z888 Allergy status to other drugs, medicaments and biological substances status: Secondary | ICD-10-CM | POA: Insufficient documentation

## 2016-12-12 DIAGNOSIS — N390 Urinary tract infection, site not specified: Secondary | ICD-10-CM

## 2016-12-12 DIAGNOSIS — R109 Unspecified abdominal pain: Secondary | ICD-10-CM | POA: Diagnosis not present

## 2016-12-12 DIAGNOSIS — K5900 Constipation, unspecified: Secondary | ICD-10-CM

## 2016-12-12 DIAGNOSIS — Z86711 Personal history of pulmonary embolism: Secondary | ICD-10-CM | POA: Insufficient documentation

## 2016-12-12 DIAGNOSIS — Z8249 Family history of ischemic heart disease and other diseases of the circulatory system: Secondary | ICD-10-CM | POA: Diagnosis not present

## 2016-12-12 DIAGNOSIS — Z3A01 Less than 8 weeks gestation of pregnancy: Secondary | ICD-10-CM | POA: Diagnosis not present

## 2016-12-12 DIAGNOSIS — O99331 Smoking (tobacco) complicating pregnancy, first trimester: Secondary | ICD-10-CM | POA: Diagnosis not present

## 2016-12-12 DIAGNOSIS — Z91010 Allergy to peanuts: Secondary | ICD-10-CM | POA: Insufficient documentation

## 2016-12-12 LAB — RAPID URINE DRUG SCREEN, HOSP PERFORMED
AMPHETAMINES: NOT DETECTED
BARBITURATES: NOT DETECTED
BENZODIAZEPINES: NOT DETECTED
COCAINE: NOT DETECTED
Opiates: NOT DETECTED
TETRAHYDROCANNABINOL: POSITIVE — AB

## 2016-12-12 LAB — URINALYSIS, ROUTINE W REFLEX MICROSCOPIC
Bilirubin Urine: NEGATIVE
GLUCOSE, UA: NEGATIVE mg/dL
Hgb urine dipstick: NEGATIVE
KETONES UR: 20 mg/dL — AB
Nitrite: POSITIVE — AB
PH: 7 (ref 5.0–8.0)
Protein, ur: 30 mg/dL — AB
SPECIFIC GRAVITY, URINE: 1.018 (ref 1.005–1.030)

## 2016-12-12 MED ORDER — DOCUSATE SODIUM 250 MG PO CAPS: 250.0000 mg | ORAL_CAPSULE | Freq: Every day | ORAL | 4 refills | Status: AC

## 2016-12-12 MED ORDER — ONDANSETRON 4 MG PO TBDP: 4.0000 mg | ORAL_TABLET | Freq: Three times a day (TID) | ORAL | 4 refills | Status: AC | PRN

## 2016-12-12 MED ORDER — NITROFURANTOIN MONOHYD MACRO 100 MG PO CAPS: 100.0000 mg | ORAL_CAPSULE | Freq: Two times a day (BID) | ORAL | 0 refills | Status: AC

## 2016-12-12 NOTE — MAU Note (Addendum)
Pt arrived via EMS.  Pt states that she is having lower abdominal pain.  Pt states that it is like a bad menstrual cramp.  Pt states that putting pressure on it sooths it.  Pt states she has not had a bowel movement since Thursday.

## 2016-12-12 NOTE — MAU Provider Note (Signed)
History   G2P0101 @ 7.5 wks with confirmed IUP on 12/03/16 in with c/o abd pain, constipation. States pink discharge when she wiped this morning.  CSN: [id number]  Arrival date & time 12/12/16  0919   None     Chief Complaint  Patient presents with  . Abdominal Pain    HPI  Past Medical History:  Diagnosis Date  . Anxiety   . Asthma   . Bipolar 1 disorder (HCC)   . Bipolar affective disorder (HCC) 04/24/2014  . Chronic kidney disease    frequent UTI  . Depression   . Headache   . 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 Schizoaffective disorder (HCC)   . Syncope     Past Surgical History:  Procedure Laterality Date  . TOOTH EXTRACTION      Family History  Problem Relation Age of Onset  . Heart attack Father   . Heart disease Father   . Hypertension Father   . Stroke Paternal Grandmother   . Anxiety disorder Mother   . Cancer Maternal Grandmother     Social History  Substance Use Topics  . Smoking status: Current Every Day Smoker    Packs/day: 0.50    Types: Cigarettes  . Smokeless tobacco: Current User  . Alcohol use No    OB History    Gravida Para Term Preterm AB Living   2 1   1  0 1   SAB TAB Ectopic Multiple Live Births   0       1      Review of Systems  Constitutional: Negative.   HENT: Negative.   Eyes: Negative.   Respiratory: Negative.   Cardiovascular: Negative.   Gastrointestinal: Positive for abdominal pain, constipation and nausea.  Endocrine: Negative.   Genitourinary: Negative.   Musculoskeletal: Negative.   Skin: Negative.   Allergic/Immunologic: Negative.   Neurological: Negative.   Hematological: Negative.   Psychiatric/Behavioral: Negative.     Allergies  Peanuts [peanut oil] and Yaz [drospirenone-ethinyl estradiol]  Home Medications   Current Outpatient Rx  . Order #: [id number] Class: Normal  . Order #: [id number] Class: Normal    BP (!) 102/52 (BP Location: Right Arm)   Pulse 62   Temp 97.9 F (36.6 C) (Oral)    Resp 18   LMP 09/30/2016   SpO2 98%   Physical Exam  Constitutional: She is oriented to person, place, and time. She appears well-developed and well-nourished.  HENT:  Head: Normocephalic.  Eyes: Pupils are equal, round, and reactive to light.  Neck: Normal range of motion.  Cardiovascular: Normal rate, regular rhythm, normal heart sounds and intact distal pulses.   Pulmonary/Chest: Effort normal and breath sounds normal.  Abdominal: Soft. Bowel sounds are normal.  Genitourinary: Vagina normal and uterus normal.  Musculoskeletal: Normal range of motion.  Neurological: She is alert and oriented to person, place, and time. She has normal reflexes.  Skin: Skin is warm and dry.  Psychiatric: She has a normal mood and affect. Her behavior is normal. Judgment and thought content normal.    MAU Course  Procedures (including critical care time)  Labs Reviewed  URINALYSIS, ROUTINE W REFLEX MICROSCOPIC - Abnormal; Notable for the following:       Result Value   APPearance CLOUDY (*)    Ketones, ur 20 (*)    Protein, ur 30 (*)    Nitrite POSITIVE (*)    Leukocytes, UA MODERATE (*)    Bacteria, UA FEW (*)    Squamous  Epithelial / LPF TOO NUMEROUS TO COUNT (*)    All other components within normal limits  RAPID URINE DRUG SCREEN, HOSP PERFORMED   No results found.   1. Constipation, unspecified constipation type   2. Abdominal pain during pregnancy in first trimester       MDM  Viable IUP at 7.5 wks Constipation Vag probe u/s shows viable active fetus at 7.5 wks. No vag bleeding noted with exam. Discussed high fiber diet and colace stool softner. Pt verbalized understanding and will d/c home

## 2016-12-12 NOTE — Discharge Instructions (Signed)
Constipation, Adult Constipation is when a person:  Poops (has a bowel movement) fewer times in a week than normal.  Has a hard time pooping.  Has poop that is dry, hard, or bigger than normal. Follow these instructions at home: Eating and drinking  Eat foods that have a lot of fiber, such as:  Fresh fruits and vegetables.  Whole grains.  Beans.  Eat less of foods that are high in fat, low in fiber, or overly processed, such as:  [place] fries.  Hamburgers.  Cookies.  Candy.  Soda.  Drink enough fluid to keep your pee (urine) clear or pale yellow. General instructions  Exercise regularly or as told by your doctor.  Go to the restroom when you feel like you need to poop. Do not hold it in.  Take over-the-counter and prescription medicines only as told by your doctor. These include any fiber supplements.  Do pelvic floor retraining exercises, such as:  Doing deep breathing while relaxing your lower belly (abdomen).  Relaxing your pelvic floor while pooping.  Watch your condition for any changes.  Keep all follow-up visits as told by your doctor. This is important. Contact a doctor if:  You have pain that gets worse.  You have a fever.  You have not pooped for 4 days.  You throw up (vomit).  You are not hungry.  You lose weight.  You are bleeding from the anus.  You have thin, pencil-like poop (stool). Get help right away if:  You have a fever, and your symptoms suddenly get worse.  You leak poop or have blood in your poop.  Your belly feels hard or bigger than normal (is bloated).  You have very bad belly pain.  You feel dizzy or you faint. This information is not intended to replace advice given to you by your health care provider. Make sure you discuss any questions you have with your health care provider. Document Released: 05/31/2008 Document Revised: 07/02/2016 Document Reviewed: 06/02/2016 Elsevier Interactive Patient Education  2017  Elsevier Inc.   Abdominal Pain During Pregnancy Belly (abdominal) pain is common during pregnancy. Most of the time, it is not a serious problem. Other times, it can be a sign that something is wrong with the pregnancy. Always tell your doctor if you have belly pain. Follow these instructions at home: Monitor your belly pain for any changes. The following actions may help you feel better:  Do not have sex (intercourse) or put anything in your vagina until you feel better.  Rest until your pain stops.  Drink clear fluids if you feel sick to your stomach (nauseous). Do not eat solid food until you feel better.  Only take medicine as told by your doctor.  Keep all doctor visits as told. Get help right away if:  You are bleeding, leaking fluid, or pieces of tissue come out of your vagina.  You have more pain or cramping.  You keep throwing up (vomiting).  You have pain when you pee (urinate) or have blood in your pee.  You have a fever.  You do not feel your baby moving as much.  You feel very weak or feel like passing out.  You have trouble breathing, with or without belly pain.  You have a very bad headache and belly pain.  You have fluid leaking from your vagina and belly pain.  You keep having watery poop (diarrhea).  Your belly pain does not go away after resting, or the pain gets worse. This  information is not intended to replace advice given to you by your health care provider. Make sure you discuss any questions you have with your health care provider. Document Released: 12/01/2009 Document Revised: 07/21/2016 Document Reviewed: 07/12/2013 Elsevier Interactive Patient Education  2017 [employer]

## 2016-12-14 LAB — URINE CULTURE

## 2017-05-15 IMAGING — US US OB TRANSVAGINAL
1 series · 15 of 28 positions shown · non-contrast
Comparison: None

CLINICAL DATA: Right lower abdominal pain

EXAM:
OBSTETRIC <14 WK US AND TRANSVAGINAL OB US
TECHNIQUE: Both transabdominal and transvaginal ultrasound examinations were
performed for complete evaluation of the gestation as well as the
maternal uterus, adnexal regions, and pelvic cul-de-sac.
Transvaginal technique was performed to assess early pregnancy.

[Series 1: us ob transvaginal · 15 of 53 slices shown]
[im 1/53]
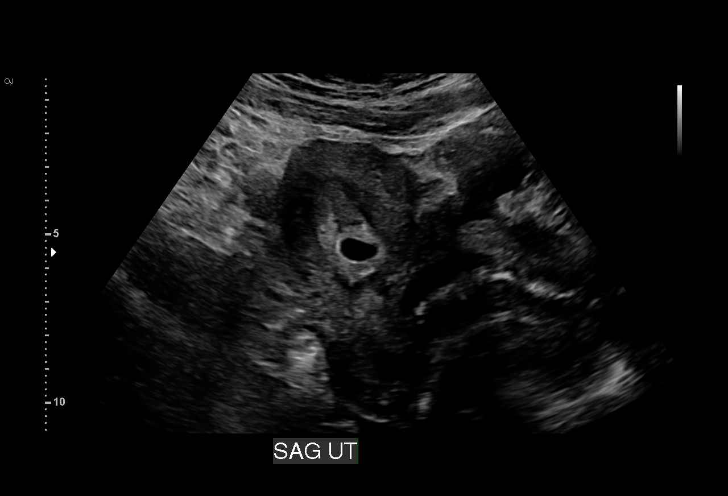
[im 4/53]
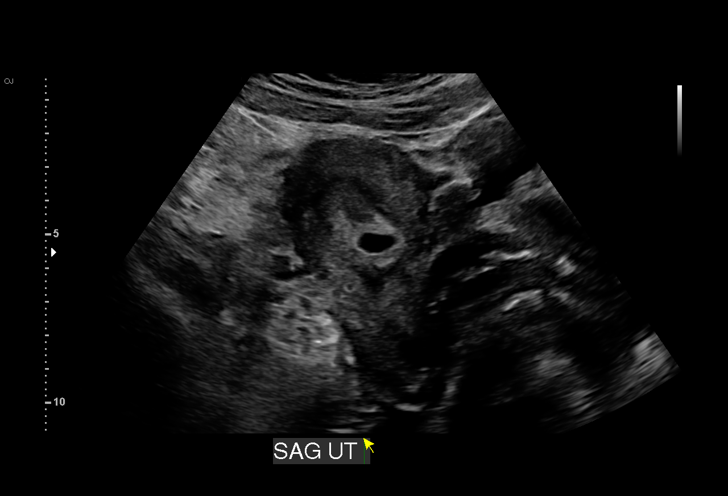
[im 8/53]
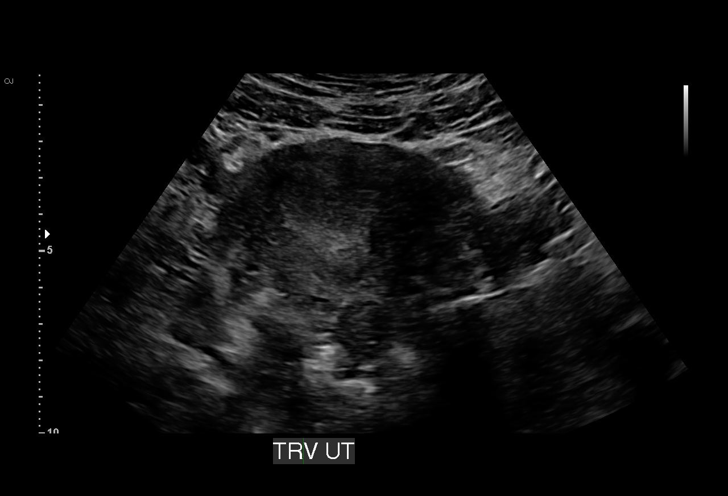
[im 12/53]
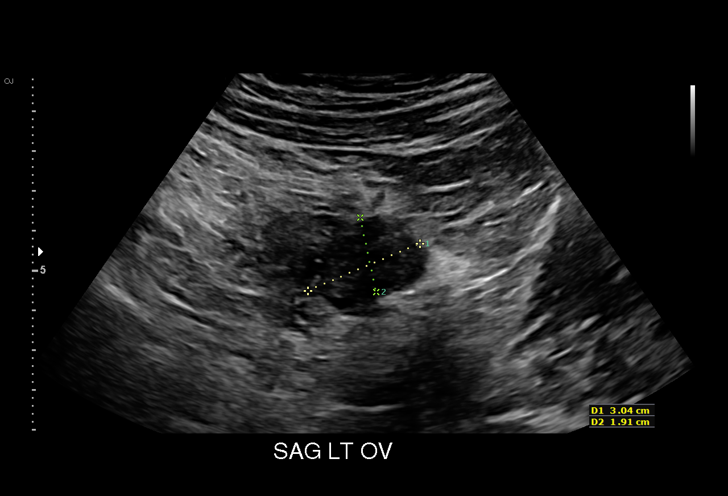
[im 16/53]
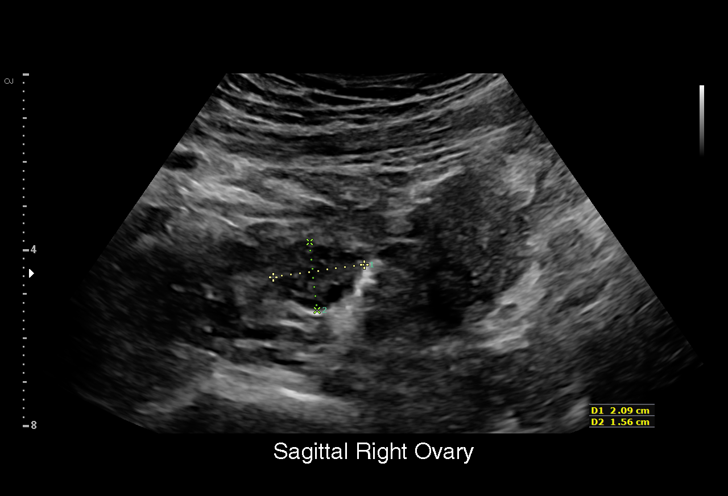
[im 20/53]
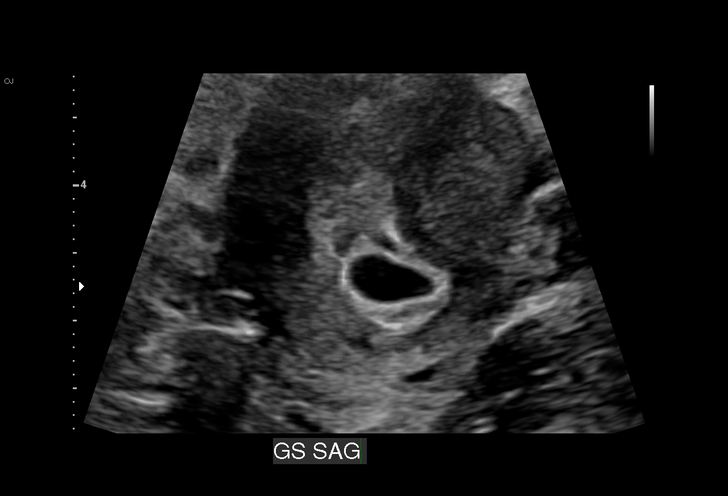
[im 24/53]
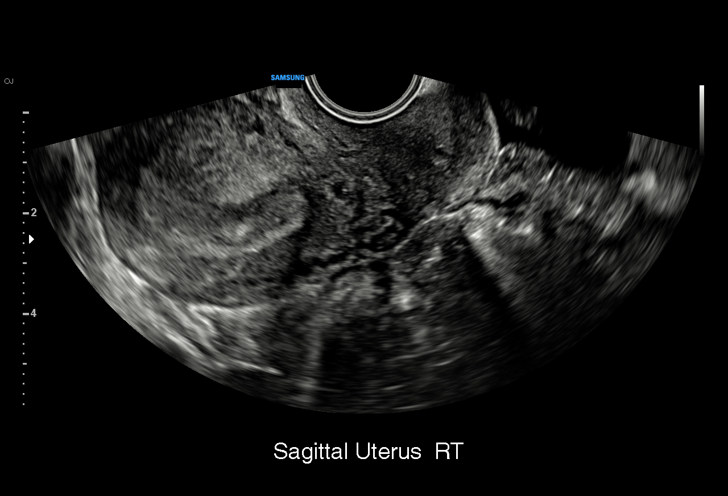
[im 27/53]
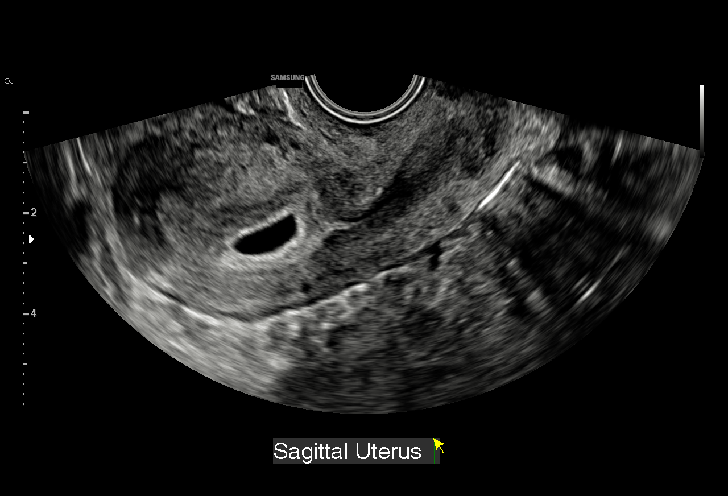
[im 29/53]
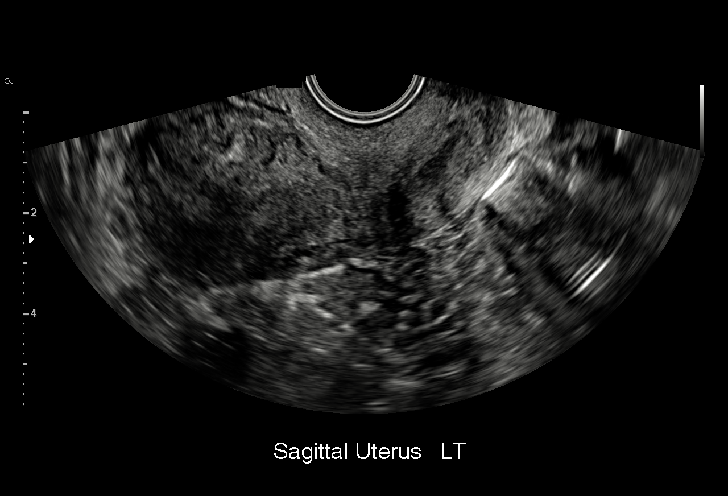
[im 33/53]
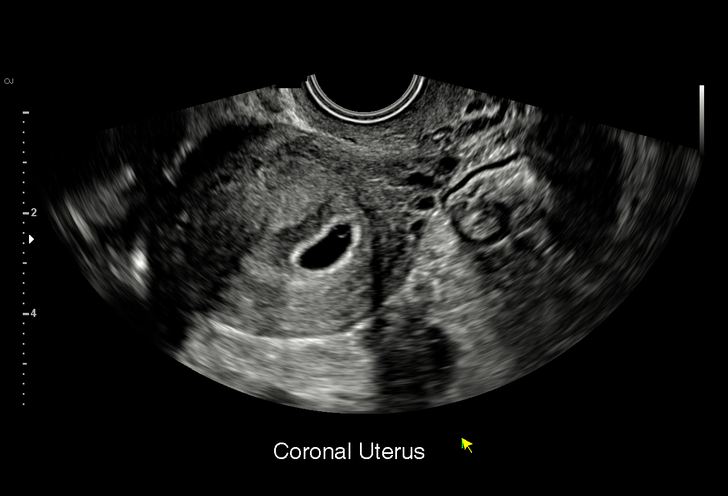
[im 37/53]
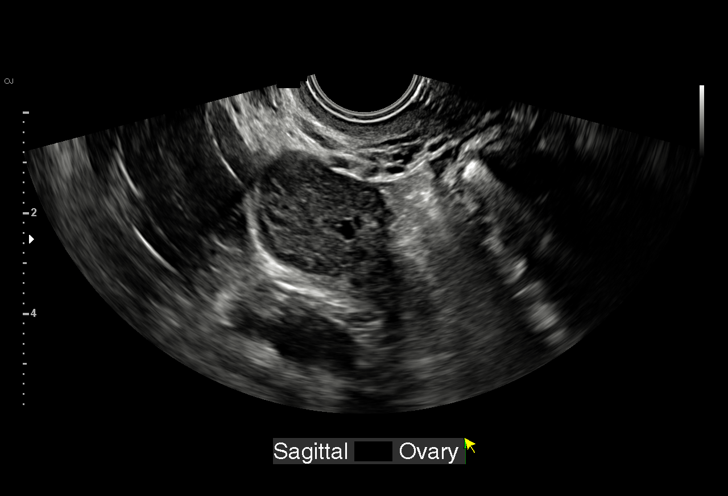
[im 41/53]
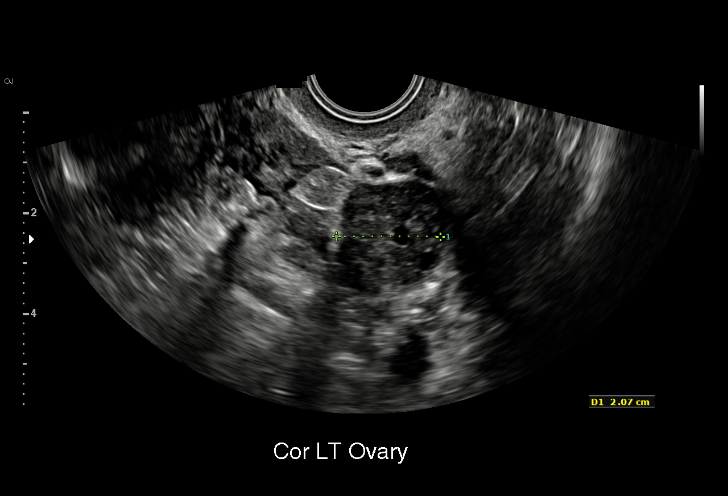
[im 45/53]
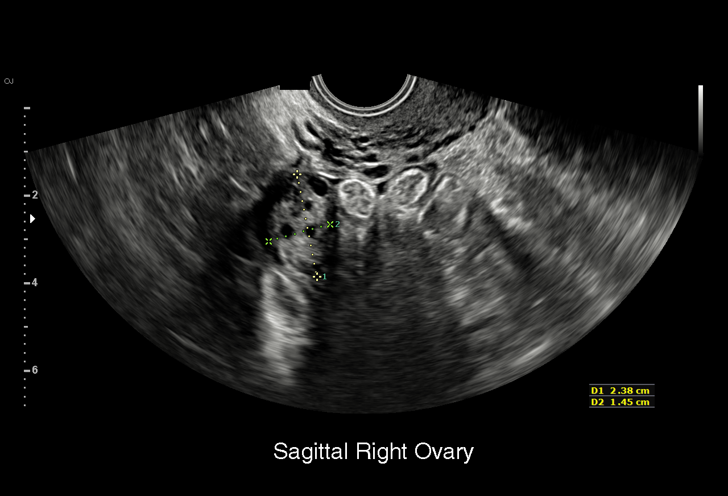
[im 49/53]
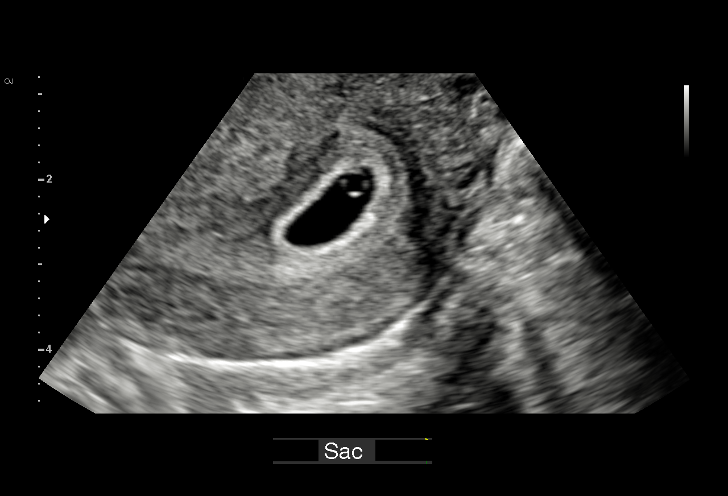
[im 53/53]
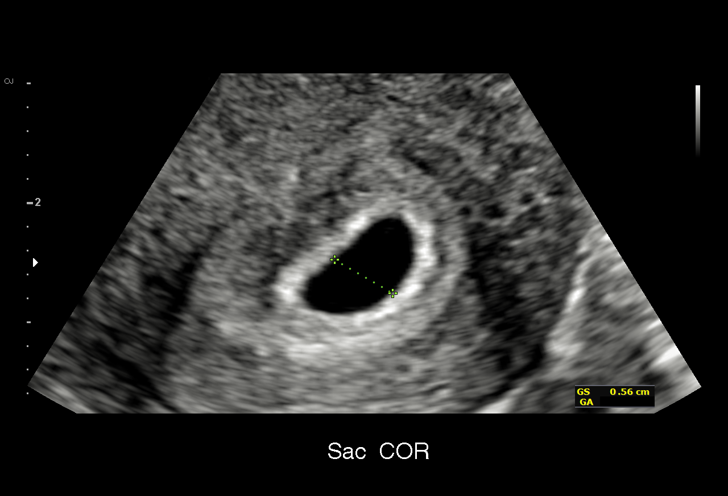

[15 of 28 positions shown; findings below may reference images not displayed]

FINDINGS: Intrauterine gestational sac: Single

Yolk sac:  Visualized.

Embryo:  Not Visualized.

MSD: 8 mm  mm   5 w   3  d

Maternal uterus/adnexae:

Subchorionic hemorrhage: None

Right ovary: Normal

Left ovary: Normal

Other :None

Free fluid:  Trace free fluid noted within the pelvis.
IMPRESSION: 1. Single intrauterine gestational sac containing a yolk sac. No
fetal pole, or cardiac activity yet visualized. Recommend follow-up
quantitative B-HCG levels and follow-up US in 14 days to assess
viability. This recommendation follows SRU consensus guidelines:
Diagnostic Criteria for Nonviable Pregnancy Early in the First
Trimester. N Engl J Med 1558; [DATE].

## 2017-10-01 ENCOUNTER — Encounter (HOSPITAL_COMMUNITY): Payer: Self-pay

## 2021-04-05 ENCOUNTER — Other Ambulatory Visit: Payer: Self-pay

## 2021-04-05 ENCOUNTER — Encounter (HOSPITAL_BASED_OUTPATIENT_CLINIC_OR_DEPARTMENT_OTHER): Payer: Self-pay | Admitting: Emergency Medicine

## 2021-04-05 ENCOUNTER — Emergency Department (HOSPITAL_BASED_OUTPATIENT_CLINIC_OR_DEPARTMENT_OTHER)
Admission: EM | Admit: 2021-04-05 | Discharge: 2021-04-05 | Disposition: A | Discharge status: Home / Self Care | Payer: Medicaid Other | Attending: Emergency Medicine | Admitting: Emergency Medicine

## 2021-04-05 DIAGNOSIS — J45909 Unspecified asthma, uncomplicated: Secondary | ICD-10-CM | POA: Diagnosis not present

## 2021-04-05 DIAGNOSIS — W500XXA Accidental hit or strike by another person, initial encounter: Secondary | ICD-10-CM | POA: Diagnosis not present

## 2021-04-05 DIAGNOSIS — Y30XXXA Falling, jumping or pushed from a high place, undetermined intent, initial encounter: Secondary | ICD-10-CM | POA: Insufficient documentation

## 2021-04-05 DIAGNOSIS — O9A212 Injury, poisoning and certain other consequences of external causes complicating pregnancy, second trimester: Secondary | ICD-10-CM | POA: Diagnosis not present

## 2021-04-05 DIAGNOSIS — F17210 Nicotine dependence, cigarettes, uncomplicated: Secondary | ICD-10-CM | POA: Insufficient documentation

## 2021-04-05 DIAGNOSIS — Z3A20 20 weeks gestation of pregnancy: Secondary | ICD-10-CM | POA: Diagnosis not present

## 2021-04-05 DIAGNOSIS — S3991XA Unspecified injury of abdomen, initial encounter: Secondary | ICD-10-CM | POA: Insufficient documentation

## 2021-04-05 DIAGNOSIS — N189 Chronic kidney disease, unspecified: Secondary | ICD-10-CM | POA: Diagnosis not present

## 2021-04-05 DIAGNOSIS — Z91010 Allergy to peanuts: Secondary | ICD-10-CM | POA: Diagnosis not present

## 2021-04-05 NOTE — ED Notes (Signed)
Pt reports she has an appointment with Dr. Marinus Maw tomorrow. She states she spoke with the office today and they wanted her to get her cervix checked. Denies vaginal bleeding, discharge, or fluid leaking from vagina. Denies pain. Alert and cooperative

## 2021-04-05 NOTE — Discharge Instructions (Signed)
DO not have intercourse or lift anything over 10 pounds until you are cleared by your ob tomorrow.  Return for any new or worsening symptoms
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
## 2021-04-05 NOTE — ED Notes (Signed)
Pt arrives pov with mother. [facility] is OB. Pt takes Lovenox r/t prior PE EDD 08/22/2021. G4P3 1st delivery 35.5 weeks. 2nd and 3rd preg FT
